# Patient Record
Sex: Male | Born: 1980 | Race: Asian | Hispanic: No | Marital: Married | Smoking: Former smoker
Health system: Southern US, Community
[De-identification: ages and names within clinical notes are randomized; demographics above are authoritative.]

---

## 2017-08-13 ENCOUNTER — Ambulatory Visit (INDEPENDENT_AMBULATORY_CARE_PROVIDER_SITE_OTHER): Payer: 59

## 2017-08-13 ENCOUNTER — Encounter: Payer: Self-pay | Admitting: Family Medicine

## 2017-08-13 ENCOUNTER — Ambulatory Visit: Payer: 59 | Admitting: Family Medicine

## 2017-08-13 VITALS — BP 100/80 | HR 91 | Ht 69.0 in | Wt 167.8 lb

## 2017-08-13 DIAGNOSIS — M79645 Pain in left finger(s): Secondary | ICD-10-CM | POA: Diagnosis not present

## 2017-08-13 DIAGNOSIS — M79642 Pain in left hand: Secondary | ICD-10-CM | POA: Insufficient documentation

## 2017-08-13 MED ORDER — DICLOFENAC SODIUM 1 % TD GEL: 4.0000 g | Freq: Four times a day (QID) | TRANSDERMAL | 1 refills | Status: DC

## 2017-08-13 NOTE — Progress Notes (Signed)
Subjective:  Leroy Johnson is a 36 y.o. male who presents today with a chief complaint of left index finger pain and to establish care.   HPI:  Left index finger pain, new problem Several year history.  He was previously seen by an orthopedist in [place].  Symptoms have significantly worsened over the past 10 days.  No clear precipitating event.  No history of prior trauma.  Has not tried any medications over the last 10 days.  Symptoms are worse with movement.  No other alleviating or aggravating factors noted.  Summary/review of notes from his orthopedist: Patient initially seen on 02/08/2017 for left index finger pain.  Physical exam noted tenderness at his metacarpal head.  He was given a prescription for Tylenol and diclofenac.  He had follow-up on 01/24/2017 with persistent pain.  At this point he had an injection performed.  Per orthopedics note consider MRI if symptoms not improving.  Lab work was also performed-see objective section for summary and review.   ROS: Per HPI, otherwise a 14 point review of systems was performed and was negative  PMH:  The following were reviewed and entered/updated in epic: History reviewed. No pertinent past medical history. Patient Active Problem List   Diagnosis Date Noted  . Pain of left hand 08/13/2017   History reviewed. No pertinent surgical history.  Family History  Problem Relation Age of Onset  . Hypertension Mother   . Diabetes Mother   . Diabetes Father    Medications- reviewed and updated No current outpatient medications on file.   No current facility-administered medications for this visit.     Allergies-reviewed and updated No Known Allergies  Social History   Socioeconomic History  . Marital status: Married    Spouse name: None  . Number of children: 1  . Years of education: None  . Highest education level: None  Social Needs  . Financial resource strain: None  . Food insecurity - worry: None  . Food  insecurity - inability: None  . Transportation needs - medical: None  . Transportation needs - non-medical: None  Occupational History  . Occupation: Sport and exercise psychologistoftware Engineer  Tobacco Use  . Smoking status: Former Games developermoker  . Smokeless tobacco: Never Used  Substance and Sexual Activity  . Alcohol use: Yes    Comment: Socially/Weekends  . Drug use: No  . Sexual activity: Yes    Partners: Female  Other Topics Concern  . None  Social History Narrative  . None   Objective:  Physical Exam: BP 100/80   Pulse 91   Ht 5\' 9"  (1.753 m)   Wt 167 lb 12.8 oz (76.1 kg)   SpO2 98%   BMI 24.78 kg/m   Gen: NAD, resting comfortably CV: RRR with no murmurs appreciated Pulm: NWOB, CTAB with no crackles, wheezes, or rhonchi GI: Normal bowel sounds present. Soft, Nontender, Nondistended. MSK:  -Left index finger: No deformities.  Full range of motion.  Strength intact.  Neurovascularly intact distally.  Nontender over MCP joint. Skin: Warm, dry Neuro: Grossly normal, moves all extremities Psych: Normal affect and thought content  Review of labs from previous provider: CBC 01/24/2017: WBC 7.7, hemoglobin 14.8, hematocrit 45.8, platelets 227  CRP 01/24/2017: 2.0 RF 01/24/2017: 7.4 Uric acid 01/24/2017: 3.0  Assessment/Plan:  Pain of left hand Unclear etiology.  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  He does have a couple of osteophytes on his plain film (moderate).  No obvious fracture or other abnormalities.  Labs performed earlier this year  in [place] do not reveal any obvious source for his pain.  His old records and labs will be scanned into the chart.  Will await radiology read of his plain film.  Start Voltaren gel.  If symptoms not improving in 1-2 weeks, would consider referral to sports medicine for intra-articular injection and further management.  May consider advanced imaging if symptoms continue to persist.   Kinslee Dalpe M. Jimmey RalphParker, MD 08/13/2017 11:00 AM

## 2017-08-13 NOTE — Patient Instructions (Signed)
Start the voltaren gel.  Let us know if not improving in 1-2 weeks.  Come back to see Dr Berline Choughigby if not.  Take care,  Dr Jimmey RalphParker

## 2017-08-13 NOTE — Assessment & Plan Note (Addendum)
Unclear etiology.  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  He does have a couple of osteophytes on his plain film (moderate).  No obvious fracture or other abnormalities.  Labs performed earlier this year in [place] do not reveal any obvious source for his pain.  His old records and labs will be scanned into the chart.  Will await radiology read of his plain film.  Start Voltaren gel.  If symptoms not improving in 1-2 weeks, would consider referral to sports medicine for intra-articular injection and further management.  May consider advanced imaging if symptoms continue to persist.

## 2017-08-14 ENCOUNTER — Encounter: Payer: Self-pay | Admitting: Family Medicine

## 2017-08-14 ENCOUNTER — Ambulatory Visit (INDEPENDENT_AMBULATORY_CARE_PROVIDER_SITE_OTHER): Payer: 59 | Admitting: Family Medicine

## 2017-08-14 VITALS — BP 100/80 | HR 85 | Ht 69.0 in | Wt 167.0 lb

## 2017-08-14 DIAGNOSIS — Z13220 Encounter for screening for lipoid disorders: Secondary | ICD-10-CM

## 2017-08-14 DIAGNOSIS — Z0000 Encounter for general adult medical examination without abnormal findings: Secondary | ICD-10-CM

## 2017-08-14 DIAGNOSIS — H547 Unspecified visual loss: Secondary | ICD-10-CM

## 2017-08-14 LAB — COMPREHENSIVE METABOLIC PANEL
ALBUMIN: 4.3 g/dL (ref 3.5–5.2)
ALK PHOS: 45 U/L (ref 39–117)
ALT: 33 U/L (ref 0–53)
AST: 22 U/L (ref 0–37)
BILIRUBIN TOTAL: 1.4 mg/dL — AB (ref 0.2–1.2)
BUN: 8 mg/dL (ref 6–23)
CO2: 31 mEq/L (ref 19–32)
Calcium: 9.5 mg/dL (ref 8.4–10.5)
Chloride: 102 mEq/L (ref 96–112)
Creatinine, Ser: 1.06 mg/dL (ref 0.40–1.50)
GFR: 82.91 mL/min (ref >60.00)
GLUCOSE: 100 mg/dL — AB (ref 70–99)
Potassium: 4.3 mEq/L (ref 3.5–5.1)
SODIUM: 137 meq/L (ref 135–145)
TOTAL PROTEIN: 7.2 g/dL (ref 6.0–8.3)

## 2017-08-14 LAB — CBC
HCT: 42.4 % (ref 39.0–52.0)
HEMOGLOBIN: 14.1 g/dL (ref 13.0–17.0)
MCHC: 33.3 g/dL (ref 30.0–36.0)
MCV: 86.4 fl (ref 78.0–100.0)
Platelets: 258 10*3/uL (ref 150.0–400.0)
RBC: 4.91 Mil/uL (ref 4.22–5.81)
RDW: 14.5 % (ref 11.5–15.5)
WBC: 5.4 10*3/uL (ref 4.0–10.5)

## 2017-08-14 LAB — LIPID PANEL
CHOLESTEROL: 186 mg/dL (ref 0–200)
HDL: 38.7 mg/dL — ABNORMAL LOW (ref >39.00)
LDL Cholesterol: 120 mg/dL — ABNORMAL HIGH (ref 0–99)
NONHDL: 147.46
Total CHOL/HDL Ratio: 5
Triglycerides: 138 mg/dL (ref 0.0–149.0)
VLDL: 27.6 mg/dL (ref 0.0–40.0)

## 2017-08-14 NOTE — Patient Instructions (Signed)
Preventive Care 18-39 Years, Male Preventive care refers to lifestyle choices and visits with your health care provider that can promote health and wellness. What does preventive care include?  A yearly physical exam. This is also called an annual well check.  Dental exams once or twice a year.  Routine eye exams. Ask your health care provider how often you should have your eyes checked.  Personal lifestyle choices, including: ? Daily care of your teeth and gums. ? Regular physical activity. ? Eating a healthy diet. ? Avoiding tobacco and drug use. ? Limiting alcohol use. ? Practicing safe sex. What happens during an annual well check? The services and screenings done by your health care provider during your annual well check will depend on your age, overall health, lifestyle risk factors, and family history of disease. Counseling Your health care provider may ask you questions about your:  Alcohol use.  Tobacco use.  Drug use.  Emotional well-being.  Home and relationship well-being.  Sexual activity.  Eating habits.  Work and work environment.  Screening You may have the following tests or measurements:  Height, weight, and BMI.  Blood pressure.  Lipid and cholesterol levels. These may be checked every 5 years starting at age 20.  Diabetes screening. This is done by checking your blood sugar (glucose) after you have not eaten for a while (fasting).  Skin check.  Hepatitis C blood test.  Hepatitis B blood test.  Sexually transmitted disease (STD) testing.  Discuss your test results, treatment options, and if necessary, the need for more tests with your health care provider. Vaccines Your health care provider may recommend certain vaccines, such as:  Influenza vaccine. This is recommended every year.  Tetanus, diphtheria, and acellular pertussis (Tdap, Td) vaccine. You may need a Td booster every 10 years.  Varicella vaccine. You may need this if you  have not been vaccinated.  HPV vaccine. If you are 26 or younger, you may need three doses over 6 months.  Measles, mumps, and rubella (MMR) vaccine. You may need at least one dose of MMR.You may also need a second dose.  Pneumococcal 13-valent conjugate (PCV13) vaccine. You may need this if you have certain conditions and have not been vaccinated.  Pneumococcal polysaccharide (PPSV23) vaccine. You may need one or two doses if you smoke cigarettes or if you have certain conditions.  Meningococcal vaccine. One dose is recommended if you are age 19-21 years and a first-year college student living in a residence hall, or if you have one of several medical conditions. You may also need additional booster doses.  Hepatitis A vaccine. You may need this if you have certain conditions or if you travel or work in places where you may be exposed to hepatitis A.  Hepatitis B vaccine. You may need this if you have certain conditions or if you travel or work in places where you may be exposed to hepatitis B.  Haemophilus influenzae type b (Hib) vaccine. You may need this if you have certain risk factors.  Talk to your health care provider about which screenings and vaccines you need and how often you need them. This information is not intended to replace advice given to you by your health care provider. Make sure you discuss any questions you have with your health care provider. Document Released: 11/07/2001 Document Revised: 05/31/2016 Document Reviewed: 07/13/2015 Elsevier Interactive Patient Education  2017 Elsevier Inc.  

## 2017-08-14 NOTE — Progress Notes (Signed)
Subjective:  Leroy ChessmanRenganathan Johnson is a 36 y.o. male who presents today for his annual comprehensive physical exam.    HPI:  Leroy GottronRenganathan Johnson BuryKannan has no acute complaints today.   Moved here a few months ago. Works as a Sport and exercise psychologistsoftware engineer. Likes to walk as stress relief.  Lifestyle Diet: Plenty of fruits and vegetables. Mostly vegetarian diet.  Exercise: Walk everyday.   Depression screen PHQ 2/9 08/13/2017  Decreased Interest 0  Down, Depressed, Hopeless 0  PHQ - 2 Score 0   Health Maintenance Due  Topic Date Due  . HIV Screening  03/06/1994    ROS: Per HPI, otherwise a 14 point review of systems was performed and was negative  PMH:  The following were reviewed and entered/updated in epic: History reviewed. No pertinent past medical history. Patient Active Problem List   Diagnosis Date Noted  . Pain of left hand 08/13/2017   History reviewed. No pertinent surgical history.  Family History  Problem Relation Age of Onset  . Hypertension Mother   . Diabetes Mother   . Diabetes Father    Medications- reviewed and updated Current Outpatient Medications  Medication Sig Dispense Refill  . diclofenac sodium (VOLTAREN) 1 % GEL Apply 4 g 4 (four) times daily topically. 100 g 1   No current facility-administered medications for this visit.     Allergies-reviewed and updated No Known Allergies  Social History   Socioeconomic History  . Marital status: Married    Spouse name: None  . Number of children: 1  . Years of education: None  . Highest education level: None  Social Needs  . Financial resource strain: None  . Food insecurity - worry: None  . Food insecurity - inability: None  . Transportation needs - medical: None  . Transportation needs - non-medical: None  Occupational History  . Occupation: Sport and exercise psychologistoftware Engineer  Tobacco Use  . Smoking status: Former Games developermoker  . Smokeless tobacco: Never Used  Substance and Sexual Activity  . Alcohol use: Yes    Comment:  Socially/Weekends  . Drug use: No  . Sexual activity: Yes    Partners: Female  Other Topics Concern  . None  Social History Narrative  . None    Objective:  Physical Exam: BP 100/80   Pulse 85   Ht 5\' 9"  (1.753 m)   Wt 167 lb (75.8 kg)   SpO2 98%   BMI 24.66 kg/m   Body mass index is 24.66 kg/m. Gen: NAD, resting comfortably HEENT: TMs normal bilaterally. OP clear. No thyromegaly noted.  CV: RRR with no murmurs appreciated Pulm: NWOB, CTAB with no crackles, wheezes, or rhonchi GI: Normal bowel sounds present. Soft, Nontender, Nondistended. MSK: no edema, cyanosis, or clubbing noted Skin: warm, dry Neuro: CN2-12 grossly intact. Strength 5/5 in upper and lower extremities. Reflexes symmetric and intact bilaterally.  Psych: Normal affect and thought content  Assessment/Plan:   Preventative Healthcare: Check lipid panel.  Check basic labs.  HIV antibody deferred.  Patient Counseling:  -Nutrition: Stressed importance of moderation in sodium/caffeine intake, saturated fat and cholesterol, caloric balance, sufficient intake of fresh fruits, vegetables, and fiber.  -Stressed the importance of regular exercise.   -Substance Abuse: Discussed cessation/primary prevention of tobacco, alcohol, or other drug use; driving or other dangerous activities under the influence; availability of treatment for abuse.   -Injury prevention: Discussed safety belts, safety helmets, smoke detector, smoking near bedding or upholstery.   -Dental health: Discussed importance of regular tooth brushing, flossing, and dental visits.  -  Health maintenance and immunizations reviewed. Please refer to Health maintenance section.  Return to care in 1 year for next preventative visit.   Katina Degreealeb M. Jimmey RalphParker, MD 08/14/2017 9:17 AM

## 2017-08-15 ENCOUNTER — Telehealth: Payer: Self-pay

## 2017-08-15 NOTE — Progress Notes (Signed)
"  Bad" cholesterol is a little low and "good" cholesterol is a little high. No need for medications at this time. Continue working on diet and exercise.   Other labs are normal.   We can recheck again next year.  Katina Degreealeb M. Jimmey RalphParker, MD 08/15/2017 8:39 AM

## 2017-08-15 NOTE — Telephone Encounter (Signed)
PA for Voltaren gel approved through 08/15/2018.  Patient's pharmacy notified.

## 2017-12-05 ENCOUNTER — Ambulatory Visit: Payer: 59 | Admitting: Physician Assistant

## 2017-12-05 ENCOUNTER — Encounter: Payer: Self-pay | Admitting: Physician Assistant

## 2017-12-05 VITALS — BP 116/82 | HR 115 | Temp 100.4°F | Ht 69.0 in | Wt 170.6 lb

## 2017-12-05 DIAGNOSIS — J029 Acute pharyngitis, unspecified: Secondary | ICD-10-CM | POA: Diagnosis not present

## 2017-12-05 DIAGNOSIS — R509 Fever, unspecified: Secondary | ICD-10-CM | POA: Diagnosis not present

## 2017-12-05 LAB — POC INFLUENZA A&B (BINAX/QUICKVUE)
Influenza A, POC: NEGATIVE
Influenza B, POC: NEGATIVE

## 2017-12-05 LAB — POCT RAPID STREP A (OFFICE): RAPID STREP A SCREEN: NEGATIVE

## 2017-12-05 NOTE — Patient Instructions (Addendum)
It was great to see you!  We have sent off your throat culture and will call you when we get the results.  You have a viral upper respiratory infection. Antibiotics are not needed for this.  Viral infections usually take 7-10 days to resolve.  The cough can last a few weeks to go away.  Push fluids and get plenty of rest. Please return if you are not improving as expected, or if you have high fevers (>101.5) or difficulty swallowing or worsening productive cough.  Call clinic with questions.  I hope you start feeling better soon!

## 2017-12-05 NOTE — Progress Notes (Signed)
Leroy Johnson is a 37 y.o. male here for a new problem.  I acted as a Neurosurgeon for [employer], PA-C Leroy Mull, LPN  History of Present Illness:   Chief Complaint  Patient presents with  . Fever    Started Sunday running more then 100.0  . Sore Throat    started Sunday     Sore Throat   This is a new problem. Episode onset: Started on Sunday. The problem has been gradually worsening. The maximum temperature recorded prior to his arrival was 100.4 - 100.9 F. The fever has been present for 1 to 2 days. Associated symptoms include coughing, headaches and trouble swallowing. Pertinent negatives include no abdominal pain, congestion, diarrhea, ear discharge, ear pain, neck pain, shortness of breath or vomiting. Associated symptoms comments: Non productive cough. He has tried nothing for the symptoms.   He reports that he continues to have these episodes every 2-3 months -- sore throat and fever and it will last 4-5 days. He has been dealing with this for "at least 20 years" and is frustrated because he doesn't know what's going on. He is unable to tell me if he has ever been tested for strep. He states that he usually just takes Tylenol and deals with it. Denies recent travel and SOB.  History reviewed. No pertinent past medical history.   Social History   Socioeconomic History  . Marital status: Married    Spouse name: Not on file  . Number of children: 1  . Years of education: Not on file  . Highest education level: Not on file  Social Needs  . Financial resource strain: Not on file  . Food insecurity - worry: Not on file  . Food insecurity - inability: Not on file  . Transportation needs - medical: Not on file  . Transportation needs - non-medical: Not on file  Occupational History  . Occupation: Sport and exercise psychologist  Tobacco Use  . Smoking status: Former Games developer  . Smokeless tobacco: Never Used  Substance and Sexual Activity  . Alcohol use: Yes    Comment:  Socially/Weekends  . Drug use: No  . Sexual activity: Yes    Partners: Female  Other Topics Concern  . Not on file  Social History Narrative  . Not on file    History reviewed. No pertinent surgical history.  Family History  Problem Relation Age of Onset  . Hypertension Mother   . Diabetes Mother   . Diabetes Father     No Known Allergies  Current Medications:  No current outpatient medications on file.   Review of Systems:   Review of Systems  HENT: Positive for trouble swallowing. Negative for congestion, ear discharge and ear pain.   Respiratory: Positive for cough. Negative for shortness of breath.   Gastrointestinal: Negative for abdominal pain, diarrhea and vomiting.  Musculoskeletal: Negative for neck pain.  Neurological: Positive for headaches.    Vitals:   Vitals:   12/05/17 1031  BP: 116/82  Pulse: (!) 115  Temp: (!) 100.4 F (38 C)  TempSrc: Oral  SpO2: 96%  Weight: 170 lb 9.6 oz (77.4 kg)  Height: 5\' 9"  (1.753 m)     Body mass index is 25.19 kg/m.  Physical Exam:   Physical Exam  Constitutional: He appears well-developed. He is cooperative.  Non-toxic appearance. He does not have a sickly appearance. He does not appear ill. No distress.  HENT:  Head: Normocephalic and atraumatic.  Right Ear: Tympanic membrane, external ear and  ear canal normal. Tympanic membrane is not erythematous, not retracted and not bulging.  Left Ear: Tympanic membrane, external ear and ear canal normal. Tympanic membrane is not erythematous, not retracted and not bulging.  Nose: Mucosal edema and rhinorrhea present. Right sinus exhibits no maxillary sinus tenderness and no frontal sinus tenderness. Left sinus exhibits no maxillary sinus tenderness and no frontal sinus tenderness.  Mouth/Throat: Uvula is midline and mucous membranes are normal. Posterior oropharyngeal erythema present. No posterior oropharyngeal edema. Tonsils are 1+ on the right. Tonsils are 1+ on the  left. No tonsillar exudate.  Post nasal drip present  Eyes: Conjunctivae and lids are normal.  Neck: Trachea normal.  Cardiovascular: Normal rate, regular rhythm, S1 normal, S2 normal and normal heart sounds.  Pulmonary/Chest: Effort normal and breath sounds normal. He has no decreased breath sounds. He has no wheezes. He has no rhonchi. He has no rales.  Lymphadenopathy:    He has no cervical adenopathy.  Neurological: He is alert.  Skin: Skin is warm, dry and intact.  Psychiatric: He has a normal mood and affect. His speech is normal and behavior is normal.  Nursing note and vitals reviewed.  Results for orders placed or performed in visit on 12/05/17  POCT rapid strep A  Result Value Ref Range   Rapid Strep A Screen Negative Negative  POC Influenza A&B(BINAX/QUICKVUE)  Result Value Ref Range   Influenza A, POC Negative Negative   Influenza B, POC Negative Negative    Assessment and Plan:    Leroy Johnson was seen today for fever and sore throat.  Diagnoses and all orders for this visit:  Acute pharyngitis, unspecified etiology and Fever, unspecified fever cause Flu and strep negative. No red flags on exam.  Continue supportive care. Discussed alternating tylenol and ibuprofen to help with symptoms. No indications for antibiotics at this time. Reviewed return precautions including worsening fever, SOB, worsening cough or other concerns. Push fluids and rest. I recommend that patient follow-up if symptoms worsen or persist despite treatment x 7-10 days, sooner if needed. I did get a throat culture, will contact patient when that results. He also requested a ENT consult today as he continues to deal with these infections on a regular basis and he wants a more thorough evaluation of his throat. -     POCT rapid strep A -     POC Influenza A&B(BINAX/QUICKVUE) -     Ambulatory referral to ENT -     Culture, Group A Strep   . Reviewed expectations re: course of current medical  issues. . Discussed self-management of symptoms. . Outlined signs and symptoms indicating need for more acute intervention. . Patient verbalized understanding and all questions were answered. . See orders for this visit as documented in the electronic medical record. . Patient received an After-Visit Summary.  CMA or LPN served as scribe during this visit. History, Physical, and Plan performed by medical provider. Documentation and orders reviewed and attested to.  Jarold MottoSamantha Tytionna Cloyd, PA-C

## 2017-12-06 ENCOUNTER — Encounter: Payer: Self-pay | Admitting: Physician Assistant

## 2017-12-07 LAB — CULTURE, GROUP A STREP
MICRO NUMBER: 90320159
SPECIMEN QUALITY: ADEQUATE

## 2017-12-11 ENCOUNTER — Encounter: Payer: Self-pay | Admitting: *Deleted

## 2017-12-11 DIAGNOSIS — J029 Acute pharyngitis, unspecified: Secondary | ICD-10-CM | POA: Diagnosis not present

## 2018-03-26 ENCOUNTER — Encounter: Payer: Self-pay | Admitting: Family Medicine

## 2018-03-26 ENCOUNTER — Ambulatory Visit: Payer: 59 | Admitting: Family Medicine

## 2018-03-26 VITALS — BP 118/64 | HR 97 | Temp 98.9°F | Ht 69.0 in | Wt 167.0 lb

## 2018-03-26 DIAGNOSIS — J329 Chronic sinusitis, unspecified: Secondary | ICD-10-CM

## 2018-03-26 DIAGNOSIS — J029 Acute pharyngitis, unspecified: Secondary | ICD-10-CM

## 2018-03-26 LAB — POCT RAPID STREP A (OFFICE): RAPID STREP A SCREEN: NEGATIVE

## 2018-03-26 MED ORDER — IPRATROPIUM BROMIDE 0.06 % NA SOLN: 2.0000 | Freq: Four times a day (QID) | NASAL | 0 refills | Status: DC

## 2018-03-26 NOTE — Patient Instructions (Signed)
It was very nice to see you today!  Please start the atrovent.   Your strep test today is negative.  We will schedule a head CT to make sure there is nothing else going on.  Please stay well-hydrated.  You can also use Tylenol and/or Motrin as needed.  Please let me know if your symptoms worsen or do not improve over the next few days.  Take care, Dr Jimmey RalphParker

## 2018-03-26 NOTE — Progress Notes (Signed)
   Subjective:  Leroy Johnson is a 37 y.o. male who presents today for same-day appointment with a chief complaint of sore throat.   HPI:  Sore Throat, acute problem Symptoms started about 5 days ago with a headache. This progressed to fever and sore throat over the past few days. Patient had a similar episode about 3 months ago and was referred to ENT.  Patient reports he was seen by ENT who told him there were no abnormalities and nothing else need to be done.  He is tried taking Tylenol which is helped some with his fever.  No cough.  No other sick contacts.  No reflux type symptoms.  No obvious alleviating or aggravating factors.  ROS: Per HPI  PMH: He reports that he has quit smoking. He has never used smokeless tobacco. He reports that he drinks alcohol. He reports that he does not use drugs.  Objective:  Physical Exam: BP 118/64 (BP Location: Left Arm, Patient Position: Sitting, Cuff Size: Normal)   Pulse 97   Temp 98.9 F (37.2 C) (Oral)   Ht 5\' 9"  (1.753 m)   Wt 167 lb (75.8 kg)   SpO2 97%   BMI 24.66 kg/m   Gen: NAD, resting comfortably HEENT: Bilateral EAC obstructed by cerumen.  Oropharynx erythematous without exudate.  Nasal mucosa erythematous with clear discharge bilaterally.  Maxillary sinuses with decreased transillumination bilaterally CV: RRR with no murmurs appreciated Pulm: NWOB, CTAB with no crackles, wheezes, or rhonchi  Rapid Strep negative  Assessment/Plan:  Sore throat/sinusitis Rapid strep negative.  Symptoms likely secondary to viral infections.  Unclear why he has recurrent symptoms every few months, though likely represents recurrent viral infections.  Will start Atrovent nasal spray to help him with his sinus congestion and rhinorrhea today.  Patient is concerned there may be something wrong with his sinuses.  We will check a facial CT to further evaluate anatomy of sinuses.  Also encouraged good oral hydration.  Recommended Tylenol and/or Motrin  as needed.  Discussed reasons to return to care.  Katina Degreealeb M. Jimmey RalphParker, MD 03/26/2018 4:10 PM

## 2018-04-25 ENCOUNTER — Encounter: Payer: Self-pay | Admitting: Family Medicine

## 2018-04-25 NOTE — Telephone Encounter (Signed)
[facility]- I will also reply to the patient and let him know he can contact them directly and schedule.

## 2018-05-06 ENCOUNTER — Ambulatory Visit
Admission: RE | Admit: 2018-05-06 | Discharge: 2018-05-06 | Disposition: A | Discharge status: Home / Self Care | Payer: 59 | Source: Ambulatory Visit | Attending: Family Medicine | Admitting: Family Medicine

## 2018-05-06 DIAGNOSIS — J323 Chronic sphenoidal sinusitis: Secondary | ICD-10-CM | POA: Diagnosis not present

## 2018-05-06 DIAGNOSIS — J329 Chronic sinusitis, unspecified: Secondary | ICD-10-CM

## 2018-05-06 DIAGNOSIS — J029 Acute pharyngitis, unspecified: Secondary | ICD-10-CM

## 2018-05-07 NOTE — Progress Notes (Signed)
Please inform patient of the following:  He has very mild inflammation in one of his left sinuses. Would like for him to see ENT if he does not have any improvement with the meds we discussed at his last appointment or if he continues to have recurrent symptoms.  Katina Degreealeb M. Jimmey RalphParker, MD 05/07/2018 8:33 AM

## 2018-07-15 ENCOUNTER — Encounter: Payer: Self-pay | Admitting: Family Medicine

## 2018-07-15 ENCOUNTER — Ambulatory Visit: Payer: 59 | Admitting: Family Medicine

## 2018-07-15 VITALS — BP 112/64 | HR 100 | Temp 98.9°F | Wt 171.1 lb

## 2018-07-15 DIAGNOSIS — J029 Acute pharyngitis, unspecified: Secondary | ICD-10-CM

## 2018-07-15 DIAGNOSIS — J329 Chronic sinusitis, unspecified: Secondary | ICD-10-CM

## 2018-07-15 DIAGNOSIS — M25562 Pain in left knee: Secondary | ICD-10-CM

## 2018-07-15 DIAGNOSIS — G8929 Other chronic pain: Secondary | ICD-10-CM

## 2018-07-15 MED ORDER — DICLOFENAC SODIUM 75 MG PO TBEC: 75.0000 mg | DELAYED_RELEASE_TABLET | Freq: Two times a day (BID) | ORAL | 0 refills | Status: DC

## 2018-07-15 NOTE — Patient Instructions (Signed)
It was very nice to see you today!  I think you have some damage done to your cartilage in your knee. Please take the anti-inflammatories and work on the exercises.  Let me know if not improving.  I will also place a referral to the ENT for you as well.   Take care, Dr Jimmey Ralph

## 2018-07-15 NOTE — Progress Notes (Signed)
   Subjective:  Leroy Johnson is a 37 y.o. male who presents today for same-day appointment with a chief complaint of knee pain.   HPI:  Knee Pain, Acute problem Started 2-3 weeks ago.Stable over that time. Located in left knee.  Worse with going from sitting to standing.  Occasionally pops.  No catching or locking.  No swelling.  No specific treatments tried.  He plays tennis regularly.  No obvious injuries or other precipitating events.  No obvious alleviating or aggravating factors.  Sore throat, chronic problem, stable Seen about 3 months ago for this.  Underwent CT scan which showed mild sinusitis.  Symptoms have persisted  ROS: Per HPI  PMH: He reports that he has quit smoking. He has never used smokeless tobacco. He reports that he drinks alcohol. He reports that he does not use drugs.  Objective:  Physical Exam: BP 112/64   Pulse 100   Temp 98.9 F (37.2 C) (Oral)   Wt 171 lb 2 oz (77.6 kg)   SpO2 98%   BMI 25.27 kg/m   Gen: NAD, resting comfortably CV: RRR with no murmurs appreciated Pulm: NWOB, CTAB with no crackles, wheezes, or rhonchi GI: Normal bowel sounds present. Soft, Nontender, Nondistended. MSK:  -Left knee: No deformities.  Palpable click with active range of motion.  Stable to varus and valgus stress.  Anterior and  posterior drawer signs negative.  McMurray test positive. -Right knee: No deformities.  Full range of motion without abnormality.  Stable to varus and valgus stress.  Anterior and posterior drawer signs negative.  Assessment/Plan:  Knee pain Exam consistent with meniscal tear.  Discussed obtaining MRI for definitive diagnosis, however patient declined.  We will proceed with conservative management including home exercise program and a course of diclofenac.  Recommended compression to the area as well.  Discussed reasons to return to care.  If symptoms worsen or do not improve with above, will need MRI and/or referral to sports medicine or  orthopedics.  Chronic sinusitis/sore throat Referral to ENT placed today.  Katina Degree. Jimmey Ralph, MD 07/15/2018 11:53 AM

## 2018-07-19 DIAGNOSIS — J342 Deviated nasal septum: Secondary | ICD-10-CM | POA: Diagnosis not present

## 2018-07-19 DIAGNOSIS — J320 Chronic maxillary sinusitis: Secondary | ICD-10-CM | POA: Diagnosis not present

## 2018-07-19 DIAGNOSIS — H6122 Impacted cerumen, left ear: Secondary | ICD-10-CM | POA: Diagnosis not present

## 2018-07-26 DIAGNOSIS — J320 Chronic maxillary sinusitis: Secondary | ICD-10-CM | POA: Diagnosis not present

## 2018-07-26 DIAGNOSIS — J305 Allergic rhinitis due to food: Secondary | ICD-10-CM | POA: Diagnosis not present

## 2018-07-26 DIAGNOSIS — J322 Chronic ethmoidal sinusitis: Secondary | ICD-10-CM | POA: Diagnosis not present

## 2018-08-12 ENCOUNTER — Encounter: Payer: Self-pay | Admitting: Family Medicine

## 2018-08-28 ENCOUNTER — Encounter: Payer: Self-pay | Admitting: Family Medicine

## 2018-08-28 ENCOUNTER — Other Ambulatory Visit: Payer: Self-pay

## 2018-08-28 DIAGNOSIS — M25562 Pain in left knee: Secondary | ICD-10-CM

## 2018-09-13 ENCOUNTER — Other Ambulatory Visit: Payer: 59

## 2019-01-08 ENCOUNTER — Telehealth: Payer: Self-pay | Admitting: Family Medicine

## 2019-01-08 NOTE — Telephone Encounter (Signed)
Pt has not been seen in over a year. Unable to leave vm  °

## 2019-06-05 IMAGING — DX DG FINGER INDEX 2+V*L*
3 series · 3 of 3 positions shown · non-contrast
Comparison: None in PACs

CLINICAL DATA: One year of pain centered over the MCP joint of the
index finger. No known injury or other trauma.

EXAM:
LEFT INDEX FINGER 2+V

[finger pa]
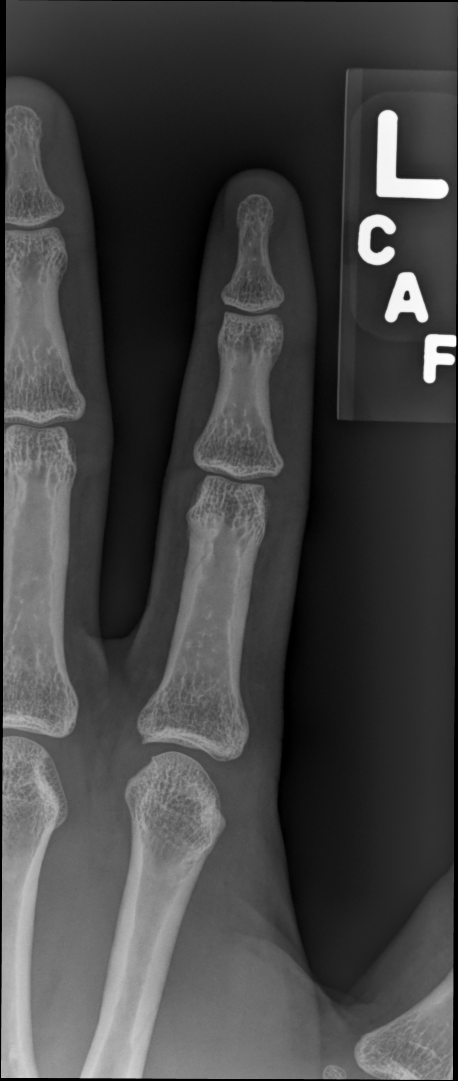

[finger oblique]
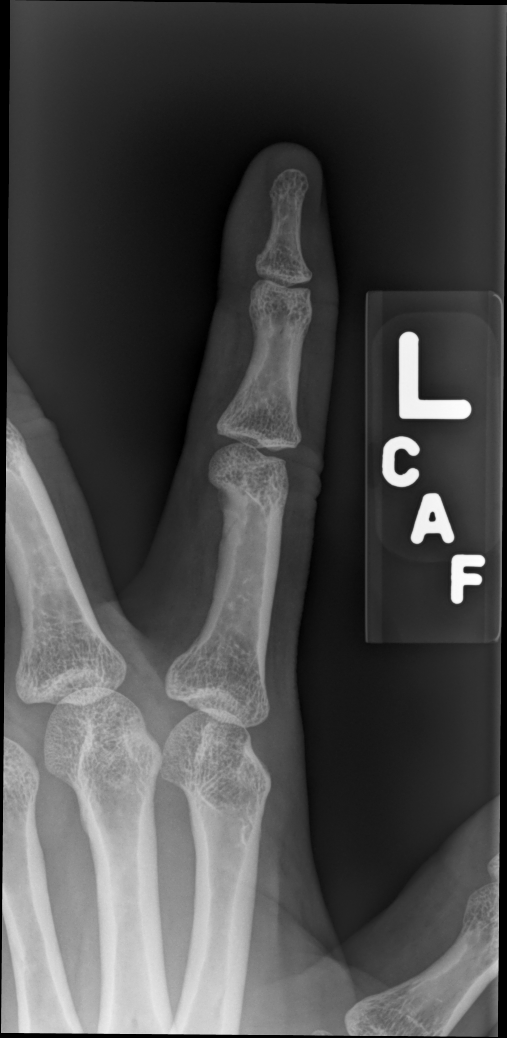

[finger lat]
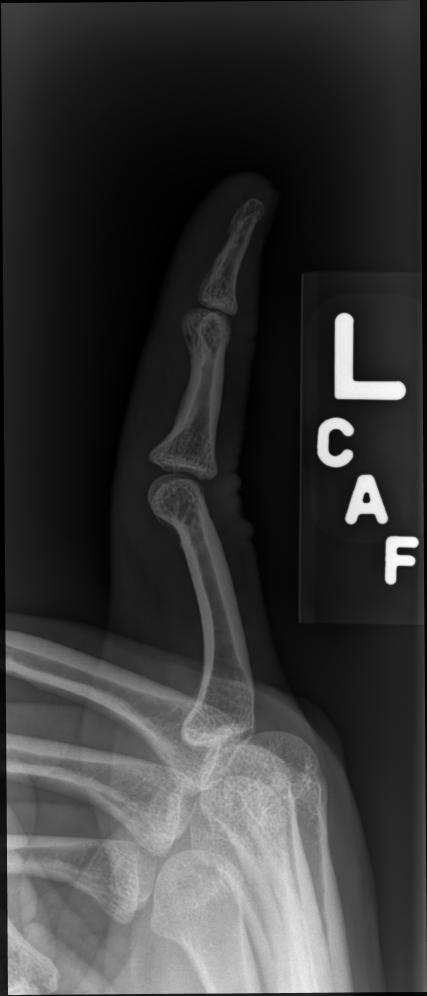

[3 of 3 positions shown; findings below may reference images not displayed]

FINDINGS: The bones of the index finger are subjectively adequately
mineralized. The IP joint spaces are well maintained. The MCP joint
space is normal. Minimal marginal spurring is noted from the lateral
aspect of the base of the proximal phalanx of the index finger. The
soft tissues are unremarkable.
IMPRESSION: There is no acute bony abnormality of the index finger. Minimal
spurring of the base of the proximal phalanx is observed.

## 2020-01-03 ENCOUNTER — Ambulatory Visit: Payer: Self-pay | Attending: Internal Medicine

## 2020-01-03 DIAGNOSIS — Z23 Encounter for immunization: Secondary | ICD-10-CM

## 2020-01-03 NOTE — Progress Notes (Signed)
   Covid-19 Vaccination Clinic  Name:  Keino Placencia    MRN: [id number] DOB: [birth year]  01/03/2020  Mr. Fayette was observed post Covid-19 immunization for 15 minutes without incident. He was provided with Vaccine Information Sheet and instruction to access the V-Safe system.   Mr. Rudin was instructed to call 911 with any severe reactions post vaccine: Marland Kitchen Difficulty breathing  . Swelling of face and throat  . A fast heartbeat  . A bad rash all over body  . Dizziness and weakness   Immunizations Administered    Name Date Dose VIS Date Route   Pfizer COVID-19 Vaccine 01/03/2020  3:10 PM 0.3 mL 09/05/2019 Intramuscular   Manufacturer: [employer], [employer]   Lot: [id number]   NDC: [address]-2

## 2020-01-23 ENCOUNTER — Encounter: Payer: 59 | Admitting: Family Medicine

## 2020-01-26 ENCOUNTER — Ambulatory Visit: Payer: Self-pay | Attending: Internal Medicine

## 2020-01-26 DIAGNOSIS — Z23 Encounter for immunization: Secondary | ICD-10-CM

## 2020-01-26 NOTE — Progress Notes (Signed)
   Covid-19 Vaccination Clinic  Name:  Leroy Johnson    MRN: [id number] DOB: [birth year]  01/26/2020  Mr. [name] was observed post Covid-19 immunization for 15 minutes without incident. He was provided with Vaccine Information Sheet and instruction to access the V-Safe system.   Mr. [name] was instructed to call 911 with any severe reactions post vaccine: Marland Kitchen Difficulty breathing  . Swelling of face and throat  . A fast heartbeat  . A bad rash all over body  . Dizziness and weakness   Immunizations Administered    Name Date Dose VIS Date Route   Pfizer COVID-19 Vaccine 01/26/2020  1:25 PM 0.3 mL 11/19/2018 Intramuscular   Manufacturer: [employer], [employer]   Lot: [id number]   NDC: [address]-4

## 2020-01-28 ENCOUNTER — Encounter: Payer: Self-pay | Admitting: Family Medicine

## 2020-02-06 ENCOUNTER — Encounter: Payer: Self-pay | Admitting: Family Medicine

## 2020-02-06 ENCOUNTER — Ambulatory Visit (INDEPENDENT_AMBULATORY_CARE_PROVIDER_SITE_OTHER): Payer: Managed Care, Other (non HMO) | Admitting: Family Medicine

## 2020-02-06 ENCOUNTER — Other Ambulatory Visit: Payer: Self-pay

## 2020-02-06 VITALS — BP 102/70 | HR 83 | Temp 97.9°F | Ht 69.0 in | Wt 169.0 lb

## 2020-02-06 DIAGNOSIS — Z13220 Encounter for screening for lipoid disorders: Secondary | ICD-10-CM | POA: Diagnosis not present

## 2020-02-06 DIAGNOSIS — R739 Hyperglycemia, unspecified: Secondary | ICD-10-CM

## 2020-02-06 DIAGNOSIS — Z0001 Encounter for general adult medical examination with abnormal findings: Secondary | ICD-10-CM | POA: Diagnosis not present

## 2020-02-06 LAB — COMPREHENSIVE METABOLIC PANEL
ALT: 21 U/L (ref 0–53)
AST: 18 U/L (ref 0–37)
Albumin: 4.6 g/dL (ref 3.5–5.2)
Alkaline Phosphatase: 60 U/L (ref 39–117)
BUN: 15 mg/dL (ref 6–23)
CO2: 29 mEq/L (ref 19–32)
Calcium: 9.1 mg/dL (ref 8.4–10.5)
Chloride: 104 mEq/L (ref 96–112)
Creatinine, Ser: 1.2 mg/dL (ref 0.40–1.50)
GFR: 66.75 mL/min (ref >60.00)
Glucose, Bld: 94 mg/dL (ref 70–99)
Potassium: 4 mEq/L (ref 3.5–5.1)
Sodium: 140 mEq/L (ref 135–145)
Total Bilirubin: 1.6 mg/dL — ABNORMAL HIGH (ref 0.2–1.2)
Total Protein: 7.1 g/dL (ref 6.0–8.3)

## 2020-02-06 LAB — TSH: TSH: 1.53 u[IU]/mL (ref 0.35–4.50)

## 2020-02-06 LAB — CBC
HCT: 42.8 % (ref 39.0–52.0)
Hemoglobin: 14.5 g/dL (ref 13.0–17.0)
MCHC: 33.9 g/dL (ref 30.0–36.0)
MCV: 85.2 fl (ref 78.0–100.0)
Platelets: 246 10*3/uL (ref 150.0–400.0)
RBC: 5.02 Mil/uL (ref 4.22–5.81)
RDW: 14.3 % (ref 11.5–15.5)
WBC: 6.9 10*3/uL (ref 4.0–10.5)

## 2020-02-06 LAB — LIPID PANEL
Cholesterol: 193 mg/dL (ref 0–200)
HDL: 32.5 mg/dL — ABNORMAL LOW (ref >39.00)
LDL Cholesterol: 128 mg/dL — ABNORMAL HIGH (ref 0–99)
NonHDL: 160.36
Total CHOL/HDL Ratio: 6
Triglycerides: 163 mg/dL — ABNORMAL HIGH (ref 0.0–149.0)
VLDL: 32.6 mg/dL (ref 0.0–40.0)

## 2020-02-06 LAB — HEMOGLOBIN A1C: Hgb A1c MFr Bld: 5.6 % (ref 4.6–6.5)

## 2020-02-06 NOTE — Patient Instructions (Signed)
It was very nice to see you today!  We will check blood work today.  Keep up the good work!  We will see back in 1 year for your next physical, or sooner if needed.  Take care, Dr Jerline Pain  Please try these tips to maintain a healthy lifestyle:   Eat at least 3 REAL meals and 1-2 snacks per day.  Aim for no more than 5 hours between eating.  If you eat breakfast, please do so within one hour of getting up.    Each meal should contain half fruits/vegetables, one quarter protein, and one quarter carbs (no bigger than a computer mouse)   Cut down on sweet beverages. This includes juice, soda, and sweet tea.     Drink at least 1 glass of water with each meal and aim for at least 8 glasses per day   Exercise at least 150 minutes every week.    Preventive Care 36-41 Years Old, Male Preventive care refers to lifestyle choices and visits with your health care provider that can promote health and wellness. This includes:  A yearly physical exam. This is also called an annual well check.  Regular dental and eye exams.  Immunizations.  Screening for certain conditions.  Healthy lifestyle choices, such as eating a healthy diet, getting regular exercise, not using drugs or products that contain nicotine and tobacco, and limiting alcohol use. What can I expect for my preventive care visit? Physical exam Your health care provider will check:  Height and weight. These may be used to calculate body mass index (BMI), which is a measurement that tells if you are at a healthy weight.  Heart rate and blood pressure.  Your skin for abnormal spots. Counseling Your health care provider may ask you questions about:  Alcohol, tobacco, and drug use.  Emotional well-being.  Home and relationship well-being.  Sexual activity.  Eating habits.  Work and work Statistician. What immunizations do I need?  Influenza (flu) vaccine  This is recommended every year. Tetanus, diphtheria,  and pertussis (Tdap) vaccine  You may need a Td booster every 10 years. Varicella (chickenpox) vaccine  You may need this vaccine if you have not already been vaccinated. Zoster (shingles) vaccine  You may need this after age 62. Measles, mumps, and rubella (MMR) vaccine  You may need at least one dose of MMR if you were born in 1957 or later. You may also need a second dose. Pneumococcal conjugate (PCV13) vaccine  You may need this if you have certain conditions and were not previously vaccinated. Pneumococcal polysaccharide (PPSV23) vaccine  You may need one or two doses if you smoke cigarettes or if you have certain conditions. Meningococcal conjugate (MenACWY) vaccine  You may need this if you have certain conditions. Hepatitis A vaccine  You may need this if you have certain conditions or if you travel or work in places where you may be exposed to hepatitis A. Hepatitis B vaccine  You may need this if you have certain conditions or if you travel or work in places where you may be exposed to hepatitis B. Haemophilus influenzae type b (Hib) vaccine  You may need this if you have certain risk factors. Human papillomavirus (HPV) vaccine  If recommended by your health care provider, you may need three doses over 6 months. You may receive vaccines as individual doses or as more than one vaccine together in one shot (combination vaccines). Talk with your health care provider about the risks and  benefits of combination vaccines. What tests do I need? Blood tests  Lipid and cholesterol levels. These may be checked every 5 years, or more frequently if you are over 67 years old.  Hepatitis C test.  Hepatitis B test. Screening  Lung cancer screening. You may have this screening every year starting at age 67 if you have a 30-pack-year history of smoking and currently smoke or have quit within the past 15 years.  Prostate cancer screening. Recommendations will vary depending on  your family history and other risks.  Colorectal cancer screening. All adults should have this screening starting at age 2 and continuing until age 71. Your health care provider may recommend screening at age 32 if you are at increased risk. You will have tests every 1-10 years, depending on your results and the type of screening test.  Diabetes screening. This is done by checking your blood sugar (glucose) after you have not eaten for a while (fasting). You may have this done every 1-3 years.  Sexually transmitted disease (STD) testing. Follow these instructions at home: Eating and drinking  Eat a diet that includes fresh fruits and vegetables, whole grains, lean protein, and low-fat dairy products.  Take vitamin and mineral supplements as recommended by your health care provider.  Do not drink alcohol if your health care provider tells you not to drink.  If you drink alcohol: ? Limit how much you have to 0-2 drinks a day. ? Be aware of how much alcohol is in your drink. In the [place] one drink equals one 12 oz bottle of beer (355 mL), one 5 oz glass of wine (148 mL), or one 1 oz glass of hard liquor (44 mL). Lifestyle  Take daily care of your teeth and gums.  Stay active. Exercise for at least 30 minutes on 5 or more days each week.  Do not use any products that contain nicotine or tobacco, such as cigarettes, e-cigarettes, and chewing tobacco. If you need help quitting, ask your health care provider.  If you are sexually active, practice safe sex. Use a condom or other form of protection to prevent STIs (sexually transmitted infections).  Talk with your health care provider about taking a low-dose aspirin every day starting at age 75. What's next?  Go to your health care provider once a year for a well check visit.  Ask your health care provider how often you should have your eyes and teeth checked.  Stay up to date on all vaccines. This information is not intended to replace  advice given to you by your health care provider. Make sure you discuss any questions you have with your health care provider. Document Revised: 09/05/2018 Document Reviewed: 09/05/2018 Elsevier Patient Education  2020 [employer].

## 2020-02-06 NOTE — Progress Notes (Signed)
Chief Complaint:  Leroy Johnson is a 39 y.o. male who presents today for his annual comprehensive physical exam.    Assessment/Plan:  Chronic Problems Addressed Today: Hyperglycemia Check A1c, CBC, C met, TSH.  Preventative Healthcare: Check CBC, C met, TSH, lipid panel.  Has already received both doses of Covid vaccine.  Patient Counseling(The following topics were reviewed and/or handout was given):  -Nutrition: Stressed importance of moderation in sodium/caffeine intake, saturated fat and cholesterol, caloric balance, sufficient intake of fresh fruits, vegetables, and fiber.  -Stressed the importance of regular exercise.   -Substance Abuse: Discussed cessation/primary prevention of tobacco, alcohol, or other drug use; driving or other dangerous activities under the influence; availability of treatment for abuse.   -Injury prevention: Discussed safety belts, safety helmets, smoke detector, smoking near bedding or upholstery.   -Sexuality: Discussed sexually transmitted diseases, partner selection, use of condoms, avoidance of unintended pregnancy and contraceptive alternatives.   -Dental health: Discussed importance of regular tooth brushing, flossing, and dental visits.  -Health maintenance and immunizations reviewed. Please refer to Health maintenance section.  Return to care in 1 year for next preventative visit.     Subjective:  HPI:  He has no acute complaints today.   Lifestyle Diet: Balanced. Plenty of fruits and vegetables.  Exercise: Going on walks daily.   Depression screen PHQ 2/9 08/13/2017  Decreased Interest 0  Down, Depressed, Hopeless 0  PHQ - 2 Score 0    Health Maintenance Due  Topic Date Due  . HIV Screening  Never done     ROS: Per HPI, otherwise a complete review of systems was negative.   PMH:  The following were reviewed and entered/updated in epic: History reviewed. No pertinent past medical history. Patient Active Problem List   Diagnosis Date Noted  . Pain of left hand 08/13/2017   History reviewed. No pertinent surgical history.  Family History  Problem Relation Age of Onset  . Hypertension Mother   . Diabetes Mother   . Diabetes Father     Medications- reviewed and updated No current outpatient medications on file.   No current facility-administered medications for this visit.    Allergies-reviewed and updated No Known Allergies  Social History   Socioeconomic History  . Marital status: Married    Spouse name: Not on file  . Number of children: 1  . Years of education: Not on file  . Highest education level: Not on file  Occupational History  . Occupation: Financial planner  Tobacco Use  . Smoking status: Former Research scientist (life sciences)  . Smokeless tobacco: Never Used  Substance and Sexual Activity  . Alcohol use: Yes    Comment: Socially/Weekends  . Drug use: No  . Sexual activity: Yes    Partners: Female  Other Topics Concern  . Not on file  Social History Narrative  . Not on file   Social Determinants of Health   Financial Resource Strain:   . Difficulty of Paying Living Expenses:   Food Insecurity:   . Worried About Charity fundraiser in the Last Year:   . Arboriculturist in the Last Year:   Transportation Needs:   . Film/video editor (Medical):   Marland Kitchen Lack of Transportation (Non-Medical):   Physical Activity:   . Days of Exercise per Week:   . Minutes of Exercise per Session:   Stress:   . Feeling of Stress :   Social Connections:   . Frequency of Communication with Friends and Family:   .  Frequency of Social Gatherings with Friends and Family:   . Attends Religious Services:   . Active Member of Clubs or Organizations:   . Attends Archivist Meetings:   Marland Kitchen Marital Status:         Objective:  Physical Exam: BP 102/70 (BP Location: Left Arm, Patient Position: Sitting, Cuff Size: Normal)   Pulse 83   Temp 97.9 F (36.6 C) (Temporal)   Ht '5\' 9"'$  (1.753 m)   Wt 169  lb (76.7 kg)   SpO2 98%   BMI 24.96 kg/m   Body mass index is 24.96 kg/m. Wt Readings from Last 3 Encounters:  02/06/20 169 lb (76.7 kg)  07/15/18 171 lb 2 oz (77.6 kg)  03/26/18 167 lb (75.8 kg)   Gen: NAD, resting comfortably HEENT: TMs normal bilaterally. OP clear. No thyromegaly noted.  CV: RRR with no murmurs appreciated Pulm: NWOB, CTAB with no crackles, wheezes, or rhonchi GI: Normal bowel sounds present. Soft, Nontender, Nondistended. MSK: no edema, cyanosis, or clubbing noted Skin: warm, dry Neuro: CN2-12 grossly intact. Strength 5/5 in upper and lower extremities. Reflexes symmetric and intact bilaterally.  Psych: Normal affect and thought content     Leroy Johnson M. Jerline Pain, MD 02/06/2020 8:47 AM

## 2020-02-06 NOTE — Progress Notes (Signed)
Please inform patient of the following:  Cholesterol is borderline but slightly up since last time. Everything else is within acceptable ranges. He should keep working on diet and exercise and we can recheck in a year or so.  Katina Degree. Jimmey Ralph, MD 02/06/2020 1:16 PM

## 2020-11-30 ENCOUNTER — Telehealth (INDEPENDENT_AMBULATORY_CARE_PROVIDER_SITE_OTHER): Payer: 59 | Admitting: Family Medicine

## 2020-11-30 DIAGNOSIS — R0981 Nasal congestion: Secondary | ICD-10-CM

## 2020-11-30 DIAGNOSIS — R059 Cough, unspecified: Secondary | ICD-10-CM

## 2020-11-30 DIAGNOSIS — R0982 Postnasal drip: Secondary | ICD-10-CM | POA: Diagnosis not present

## 2020-11-30 MED ORDER — BENZONATATE 100 MG PO CAPS
100.0000 mg | ORAL_CAPSULE | Freq: Three times a day (TID) | ORAL | 0 refills | Status: DC | PRN
Start: 2020-11-30 — End: 2021-03-01

## 2020-11-30 MED ORDER — FLUTICASONE PROPIONATE 50 MCG/ACT NA SUSP
2.0000 | Freq: Every day | NASAL | 0 refills | Status: DC
Start: 2020-11-30 — End: 2021-03-01

## 2020-11-30 NOTE — Progress Notes (Signed)
Virtual Visit via Video Note  I connected with Cruise  on 11/30/20 at 10:40 AM EST by a video enabled telemedicine application and verified that I am speaking with the correct person using two identifiers.  Location patient: home, [place] Location provider:work or home office Persons participating in the virtual visit: patient, provider  I discussed the limitations of evaluation and management by telemedicine and the availability of in person appointments. The patient expressed understanding and agreed to proceed.   HPI:  Acute telemedicine visit for cough: -Onset: 2 weeks ago -Symptoms include: started with a cold a few weeks ago, lingering nasal congestion, PND and cough - mucus is clear at this point -Denies: fever, SOB, CP, facial pain, NVD, inability to get out of bed/eat/drink, fatigue or malaise -covid test was negative -energy is good -Has tried:cough syrup -Pertinent past medical history: some seasonal allergies in the past -Pertinent medication allergies:nkda -COVID-19 vaccine status: vaccinated x 3  ROS: See pertinent positives negatives per HPI. and  No past medical history on file.  No past surgical history on file.   Current Outpatient Medications:  .  benzonatate (TESSALON PERLES) 100 MG capsule, Take 1 capsule (100 mg total) by mouth 3 (three) times daily as needed., Disp: 20 capsule, Rfl: 0 .  fluticasone (FLONASE) 50 MCG/ACT nasal spray, Place 2 sprays into both nostrils daily., Disp: 16 g, Rfl: 0  EXAM:  VITALS per patient if applicable:  GENERAL: alert, oriented, appears well and in no acute distress  HEENT: atraumatic, conjunttiva clear, no obvious abnormalities on inspection of external nose and ears  NECK: normal movements of the head and neck  LUNGS: on inspection no signs of respiratory distress, breathing rate appears normal, no obvious gross SOB, gasping or wheezing  CV: no obvious cyanosis  MS: moves all visible extremities without  noticeable abnormality  PSYCH/NEURO: pleasant and cooperative, no obvious depression or anxiety, speech and thought processing grossly intact  ASSESSMENT AND PLAN:  Discussed the following assessment and plan:  Nasal congestion  Cough  PND (post-nasal drip)  -we discussed possible serious and likely etiologies, options for evaluation and workup, limitations of telemedicine visit vs in person visit, treatment, treatment risks and precautions. Pt prefers to treat via telemedicine empirically rather than in person at this moment.  Query post viral symptoms versus allergic rectitis versus other.  Opted to try Flonase 2 sprays each nostril daily for 2 to 3 weeks, Allegra once daily and Tessalon for cough. Scheduled follow up with PCP offered: Advised to follow-up with his primary care doctor if needed, he agrees to schedule if symptoms worsen, new symptoms arise or symptoms do not improve with treatment. Discussed options for inperson care if PCP office not available. Did let this patient know that I only do telemedicine on Tuesdays and Thursdays for [employer]. Advised to schedule follow up visit with PCP or [facility] if any further questions or concerns to avoid delays in care.   I discussed the assessment and treatment plan with the patient. The patient was provided an opportunity to ask questions and all were answered. The patient agreed with the plan and demonstrated an understanding of the instructions.     Terressa Koyanagi, DO

## 2020-11-30 NOTE — Patient Instructions (Signed)
-  I sent the medication(s) we discussed to your pharmacy: Meds ordered this encounter  Medications  . benzonatate (TESSALON PERLES) 100 MG capsule    Sig: Take 1 capsule (100 mg total) by mouth 3 (three) times daily as needed.    Dispense:  20 capsule    Refill:  0  . fluticasone (FLONASE) 50 MCG/ACT nasal spray    Sig: Place 2 sprays into both nostrils daily.    Dispense:  16 g    Refill:  0   Allegra once daily for 2 to 3 weeks.  I hope you are feeling better soon!  Seek in person care promptly if your symptoms worsen, new concerns arise or you are not improving with treatment.  It was nice to meet you today. I help [employer] out with telemedicine visits on Tuesdays and Thursdays and am available for visits on those days. If you have any concerns or questions following this visit please schedule a follow up visit with your Primary Care doctor or seek care at a local urgent care clinic to avoid delays in care.

## 2020-12-22 ENCOUNTER — Other Ambulatory Visit: Payer: Self-pay | Admitting: Family Medicine

## 2021-03-01 ENCOUNTER — Ambulatory Visit (INDEPENDENT_AMBULATORY_CARE_PROVIDER_SITE_OTHER): Payer: 59 | Admitting: Family Medicine

## 2021-03-01 ENCOUNTER — Other Ambulatory Visit: Payer: Self-pay

## 2021-03-01 VITALS — BP 107/71 | HR 71 | Temp 98.0°F | Ht 69.0 in | Wt 179.0 lb

## 2021-03-01 DIAGNOSIS — Z0001 Encounter for general adult medical examination with abnormal findings: Secondary | ICD-10-CM

## 2021-03-01 DIAGNOSIS — E785 Hyperlipidemia, unspecified: Secondary | ICD-10-CM | POA: Diagnosis not present

## 2021-03-01 DIAGNOSIS — G8929 Other chronic pain: Secondary | ICD-10-CM

## 2021-03-01 DIAGNOSIS — M25511 Pain in right shoulder: Secondary | ICD-10-CM

## 2021-03-01 DIAGNOSIS — R7309 Other abnormal glucose: Secondary | ICD-10-CM | POA: Insufficient documentation

## 2021-03-01 DIAGNOSIS — Z6826 Body mass index (BMI) 26.0-26.9, adult: Secondary | ICD-10-CM

## 2021-03-01 DIAGNOSIS — E663 Overweight: Secondary | ICD-10-CM

## 2021-03-01 LAB — CBC
HCT: 43.7 % (ref 39.0–52.0)
Hemoglobin: 14.8 g/dL (ref 13.0–17.0)
MCHC: 34 g/dL (ref 30.0–36.0)
MCV: 85 fl (ref 78.0–100.0)
Platelets: 221 10*3/uL (ref 150.0–400.0)
RBC: 5.14 Mil/uL (ref 4.22–5.81)
RDW: 14 % (ref 11.5–15.5)
WBC: 5.4 10*3/uL (ref 4.0–10.5)

## 2021-03-01 LAB — COMPREHENSIVE METABOLIC PANEL
ALT: 44 U/L (ref 0–53)
AST: 30 U/L (ref 0–37)
Albumin: 4.6 g/dL (ref 3.5–5.2)
Alkaline Phosphatase: 50 U/L (ref 39–117)
BUN: 11 mg/dL (ref 6–23)
CO2: 25 mEq/L (ref 19–32)
Calcium: 9.6 mg/dL (ref 8.4–10.5)
Chloride: 103 mEq/L (ref 96–112)
Creatinine, Ser: 1.14 mg/dL (ref 0.40–1.50)
GFR: 79.62 mL/min (ref >60.00)
Glucose, Bld: 96 mg/dL (ref 70–99)
Potassium: 4.2 mEq/L (ref 3.5–5.1)
Sodium: 138 mEq/L (ref 135–145)
Total Bilirubin: 1.7 mg/dL — ABNORMAL HIGH (ref 0.2–1.2)
Total Protein: 7.6 g/dL (ref 6.0–8.3)

## 2021-03-01 LAB — LIPID PANEL
Cholesterol: 213 mg/dL — ABNORMAL HIGH (ref 0–200)
HDL: 41.9 mg/dL (ref >39.00)
LDL Cholesterol: 146 mg/dL — ABNORMAL HIGH (ref 0–99)
NonHDL: 171.51
Total CHOL/HDL Ratio: 5
Triglycerides: 130 mg/dL (ref 0.0–149.0)
VLDL: 26 mg/dL (ref 0.0–40.0)

## 2021-03-01 LAB — TSH: TSH: 1.4 u[IU]/mL (ref 0.35–4.50)

## 2021-03-01 LAB — HEMOGLOBIN A1C: Hgb A1c MFr Bld: 5.9 % (ref 4.6–6.5)

## 2021-03-01 NOTE — Patient Instructions (Signed)
It was very nice to see you today!  I will refer you to see a physical therapist for your shoulder.  We will check blood work today.  I will see you back in year.  Come back to see me sooner if needed.  Take care, Dr Jimmey Ralph  PLEASE NOTE:  If you had any lab tests please let us know if you have not heard back within a few days. You may see your results on mychart before we have a chance to review them but we will give you a call once they are reviewed by [place]. If we ordered any referrals today, please let us know if you have not heard from their office within the next week.   Please try these tips to maintain a healthy lifestyle:   Eat at least 3 REAL meals and 1-2 snacks per day.  Aim for no more than 5 hours between eating.  If you eat breakfast, please do so within one hour of getting up.    Each meal should contain half fruits/vegetables, one quarter protein, and one quarter carbs (no bigger than a computer mouse)   Cut down on sweet beverages. This includes juice, soda, and sweet tea.     Drink at least 1 glass of water with each meal and aim for at least 8 glasses per day   Exercise at least 150 minutes every week.    Preventive Care 25-76 Years Old, Male Preventive care refers to lifestyle choices and visits with your health care provider that can promote health and wellness. This includes:  A yearly physical exam. This is also called an annual wellness visit.  Regular dental and eye exams.  Immunizations.  Screening for certain conditions.  Healthy lifestyle choices, such as: ? Eating a healthy diet. ? Getting regular exercise. ? Not using drugs or products that contain nicotine and tobacco. ? Limiting alcohol use. What can I expect for my preventive care visit? Physical exam Your health care provider will check your:  Height and weight. These may be used to calculate your BMI (body mass index). BMI is a measurement that tells if you are at a healthy  weight.  Heart rate and blood pressure.  Body temperature.  Skin for abnormal spots. Counseling Your health care provider may ask you questions about your:  Past medical problems.  Family's medical history.  Alcohol, tobacco, and drug use.  Emotional well-being.  Home life and relationship well-being.  Sexual activity.  Diet, exercise, and sleep habits.  Work and work Astronomer.  Access to firearms. What immunizations do I need? Vaccines are usually given at various ages, according to a schedule. Your health care provider will recommend vaccines for you based on your age, medical history, and lifestyle or other factors, such as travel or where you work.   What tests do I need? Blood tests  Lipid and cholesterol levels. These may be checked every 5 years, or more often if you are over 81 years old.  Hepatitis C test.  Hepatitis B test. Screening  Lung cancer screening. You may have this screening every year starting at age 3 if you have a 30-pack-year history of smoking and currently smoke or have quit within the past 15 years.  Prostate cancer screening. Recommendations will vary depending on your family history and other risks.  Genital exam to check for testicular cancer or hernias.  Colorectal cancer screening. ? All adults should have this screening starting at age 1 and continuing until age  75. ? Your health care provider may recommend screening at age 80 if you are at increased risk. ? You will have tests every 1-10 years, depending on your results and the type of screening test.  Diabetes screening. ? This is done by checking your blood sugar (glucose) after you have not eaten for a while (fasting). ? You may have this done every 1-3 years.  STD (sexually transmitted disease) testing, if you are at risk. Follow these instructions at home: Eating and drinking  Eat a diet that includes fresh fruits and vegetables, whole grains, lean protein, and  low-fat dairy products.  Take vitamin and mineral supplements as recommended by your health care provider.  Do not drink alcohol if your health care provider tells you not to drink.  If you drink alcohol: ? Limit how much you have to 0-2 drinks a day. ? Be aware of how much alcohol is in your drink. In the [place] one drink equals one 12 oz bottle of beer (355 mL), one 5 oz glass of wine (148 mL), or one 1 oz glass of hard liquor (44 mL).   Lifestyle  Take daily care of your teeth and gums. Brush your teeth every morning and night with fluoride toothpaste. Floss one time each day.  Stay active. Exercise for at least 30 minutes 5 or more days each week.  Do not use any products that contain nicotine or tobacco, such as cigarettes, e-cigarettes, and chewing tobacco. If you need help quitting, ask your health care provider.  Do not use drugs.  If you are sexually active, practice safe sex. Use a condom or other form of protection to prevent STIs (sexually transmitted infections).  If told by your health care provider, take low-dose aspirin daily starting at age 42.  Find healthy ways to cope with stress, such as: ? Meditation, yoga, or listening to music. ? Journaling. ? Talking to a trusted person. ? Spending time with friends and family. Safety  Always wear your seat belt while driving or riding in a vehicle.  Do not drive: ? If you have been drinking alcohol. Do not ride with someone who has been drinking. ? When you are tired or distracted. ? While texting.  Wear a helmet and other protective equipment during sports activities.  If you have firearms in your house, make sure you follow all gun safety procedures. What's next?  Go to your health care provider once a year for an annual wellness visit.  Ask your health care provider how often you should have your eyes and teeth checked.  Stay up to date on all vaccines. This information is not intended to replace advice  given to you by your health care provider. Make sure you discuss any questions you have with your health care provider. Document Revised: 06/10/2019 Document Reviewed: 09/05/2018 Elsevier Patient Education  2021 [employer].

## 2021-03-01 NOTE — Assessment & Plan Note (Signed)
Check labs 

## 2021-03-01 NOTE — Progress Notes (Signed)
Chief Complaint:  Leroy Johnson is a 40 y.o. male who presents today for his annual comprehensive physical exam.    Assessment/Plan:  New/Acute Problems: Right shoulder pain Consistent with rotator cuff tendinopathy based on exam.  Will place referral to physical therapy for further management.  Chronic Problems Addressed Today: Dyslipidemia Discussed lifestyle modifications.  Check labs today.  Blood glucose abnormal Check labs.  Body mass index is 26.43 kg/m. / Overweight  BMI Metric Follow Up - 03/01/21 1017      BMI Metric Follow Up-Please document annually   BMI Metric Follow Up Education provided           Preventative Healthcare: Check labs today.  Up-to-date on vaccines.  Patient Counseling(The following topics were reviewed and/or handout was given):  -Nutrition: Stressed importance of moderation in sodium/caffeine intake, saturated fat and cholesterol, caloric balance, sufficient intake of fresh fruits, vegetables, and fiber.  -Stressed the importance of regular exercise.   -Substance Abuse: Discussed cessation/primary prevention of tobacco, alcohol, or other drug use; driving or other dangerous activities under the influence; availability of treatment for abuse.   -Injury prevention: Discussed safety belts, safety helmets, smoke detector, smoking near bedding or upholstery.   -Sexuality: Discussed sexually transmitted diseases, partner selection, use of condoms, avoidance of unintended pregnancy and contraceptive alternatives.   -Dental health: Discussed importance of regular tooth brushing, flossing, and dental visits.  -Health maintenance and immunizations reviewed. Please refer to Health maintenance section.  Return to care in 1 year for next preventative visit.     Subjective:  HPI:  Has had some right shoulder pain for the last year or so. No injuries or obvious precipitating events. Worse with certain motions. No specific treatments tried.    Lifestyle Diet: Balanced. Plenty of fruits and vegetables.  Exercise: Trying to walk more.   Depression screen PHQ 2/9 03/01/2021  Decreased Interest 1  Down, Depressed, Hopeless 1  PHQ - 2 Score 2  Altered sleeping 1  Tired, decreased energy 0  Change in appetite 0  Feeling bad or failure about yourself  0  Trouble concentrating 0  Moving slowly or fidgety/restless 0  Suicidal thoughts 0  PHQ-9 Score 3  Difficult doing work/chores Somewhat difficult    Health Maintenance Due  Topic Date Due  . HIV Screening  Never done  . Hepatitis C Screening  Never done     ROS: Per HPI, otherwise a complete review of systems was negative.   PMH:  The following were reviewed and entered/updated in epic: No past medical history on file. Patient Active Problem List   Diagnosis Date Noted  . Blood glucose abnormal 03/01/2021  . Dyslipidemia 03/01/2021   No past surgical history on file.  Family History  Problem Relation Age of Onset  . Hypertension Mother   . Diabetes Mother   . Diabetes Father     Medications- reviewed and updated No current outpatient medications on file.   No current facility-administered medications for this visit.    Allergies-reviewed and updated No Known Allergies  Social History   Socioeconomic History  . Marital status: Married    Spouse name: Not on file  . Number of children: 1  . Years of education: Not on file  . Highest education level: Not on file  Occupational History  . Occupation: Sport and exercise psychologist  Tobacco Use  . Smoking status: Former Games developer  . Smokeless tobacco: Never Used  Vaping Use  . Vaping Use: Never used  Substance and Sexual  Activity  . Alcohol use: Yes    Comment: Socially/Weekends  . Drug use: No  . Sexual activity: Yes    Partners: Female  Other Topics Concern  . Not on file  Social History Narrative  . Not on file   Social Determinants of Health   Financial Resource Strain: Not on file  Food  Insecurity: Not on file  Transportation Needs: Not on file  Physical Activity: Not on file  Stress: Not on file  Social Connections: Not on file        Objective:  Physical Exam: BP 107/71   Pulse 71   Temp 98 F (36.7 C) (Temporal)   Ht 5\' 9"  (1.753 m)   Wt 179 lb (81.2 kg)   SpO2 100%   BMI 26.43 kg/m   Body mass index is 26.43 kg/m. Wt Readings from Last 3 Encounters:  03/01/21 179 lb (81.2 kg)  02/06/20 169 lb (76.7 kg)  07/15/18 171 lb 2 oz (77.6 kg)   Gen: NAD, resting comfortably HEENT: TMs normal bilaterally. OP clear. No thyromegaly noted.  CV: RRR with no murmurs appreciated Pulm: NWOB, CTAB with no crackles, wheezes, or rhonchi GI: Normal bowel sounds present. Soft, Nontender, Nondistended. MSK: no edema, cyanosis, or clubbing noted.  Right shoulder without deformities.  Weakness with resisted supraspinatus testing on the right.  Normal internal and external rotation on right shoulder.  Positive Neer's test. Skin: warm, dry Neuro: CN2-12 grossly intact. Strength 5/5 in upper and lower extremities. Reflexes symmetric and intact bilaterally.  Psych: Normal affect and thought content     Leroy Johnson M. 07/17/18, MD 03/01/2021 10:17 AM

## 2021-03-01 NOTE — Assessment & Plan Note (Signed)
Discussed lifestyle modifications.  Check labs today.  

## 2021-03-02 NOTE — Progress Notes (Signed)
Please inform patient of the following:  His cholesterol and blood sugar are up a but from last year. We do not need to start medications but he should work on diet and exercise and we can recheck in a year.  Katina Degree. Jimmey Ralph, MD 03/02/2021 10:42 AM

## 2021-03-14 ENCOUNTER — Encounter: Payer: Self-pay | Admitting: Physical Therapy

## 2021-03-14 ENCOUNTER — Ambulatory Visit: Payer: 59 | Attending: Family Medicine | Admitting: Physical Therapy

## 2021-03-14 ENCOUNTER — Other Ambulatory Visit: Payer: Self-pay

## 2021-03-14 DIAGNOSIS — R252 Cramp and spasm: Secondary | ICD-10-CM | POA: Diagnosis present

## 2021-03-14 DIAGNOSIS — M25511 Pain in right shoulder: Secondary | ICD-10-CM | POA: Insufficient documentation

## 2021-03-14 DIAGNOSIS — G8929 Other chronic pain: Secondary | ICD-10-CM | POA: Insufficient documentation

## 2021-03-14 DIAGNOSIS — M25611 Stiffness of right shoulder, not elsewhere classified: Secondary | ICD-10-CM | POA: Diagnosis present

## 2021-03-14 NOTE — Therapy (Signed)
[facility] [address] [place], [place], [address] Phone: [phone]   Fax:  [phone]  Physical Therapy Evaluation  Patient Details  Name: Leroy Johnson MRN: [id number] Date of Birth: 1981-02-09 Referring Provider (PT): Ardith Dark, MD   Encounter Date: 03/14/2021   PT End of Session - 03/14/21 0821     Visit Number 1    Date for PT Re-Evaluation 06/06/21    Authorization Type aetna    PT Start Time 0810   late   PT Stop Time 0845    PT Time Calculation (min) 35 min    Activity Tolerance Patient tolerated treatment well    Behavior During Therapy [facility] for tasks assessed/performed             History reviewed. No pertinent past medical history.  History reviewed. No pertinent surgical history.  There were no vitals filed for this visit.    Subjective Assessment - 03/14/21 0814     Subjective Pt states he had pain starting a couple of years ago that started when playing cricket    Limitations Lifting;House hold activities;Other (comment)    Patient Stated Goals be able to do normal things around the house    Currently in Pain? Yes    Pain Score 7     Pain Location Shoulder    Pain Orientation Right;Anterior    Pain Descriptors / Indicators Sharp    Pain Type Chronic pain    Pain Onset More than a month ago    Pain Frequency Intermittent    Aggravating Factors  raising up and down will have pain for 5-10 minutes, sleeping on Rt side    Pain Relieving Factors not raising    Effect of Pain on Daily Activities playing cricket and lifting heavy objects    Multiple Pain Sites No                [facility] PT Assessment - 03/14/21 0001       Assessment   Medical Diagnosis M25.511,G89.29 (ICD-10-CM) - Chronic right shoulder pain    Referring Provider (PT) Ardith Dark, MD    Onset Date/Surgical Date --   2 years ago   Hand Dominance Right    Prior Therapy No      Precautions    Precautions None      Balance Screen   Has the patient fallen in the past 6 months No      Home Environment   Living Environment Private residence    Living Arrangements Spouse/significant other;Children   son     Prior Function   Level of Independence Independent    Vocation Full time employment    Vocation Requirements desk work      Cognition   Overall Cognitive Status Within Functional Limits for tasks assessed      Posture/Postural Control   Posture/Postural Control Postural limitations    Postural Limitations Rounded Shoulders;Increased thoracic kyphosis   tight pecs and upper traps, elevates shoulders and leans to the left in sitting     ROM / Strength   AROM / PROM / Strength AROM;Strength;PROM      AROM   AROM Assessment Site Shoulder    Right/Left Shoulder Right;Left    Right Shoulder Extension 58 Degrees    Right Shoulder Flexion 148 Degrees    Right Shoulder ABduction 132 Degrees    Right Shoulder Internal Rotation --   HBB to L3   Left Shoulder Extension  60 Degrees    Left Shoulder Flexion 172 Degrees    Left Shoulder ABduction 156 Degrees    Left Shoulder Internal Rotation --   HBB T8     PROM   PROM Assessment Site Shoulder    Right/Left Shoulder Right;Left    Right Shoulder Flexion 150 Degrees   pain   Right Shoulder ABduction 100 Degrees   pain   Right Shoulder Internal Rotation 50 Degrees    Right Shoulder External Rotation 100 Degrees      Strength   Overall Strength Comments bil 5/5 and no pain      Palpation   Palpation comment pain subacromial; pain along right upper trap; pain and tight anterior delt right side      Special Tests   Other special tests positive neer test and hawkins kennedy Rt side                        Objective measurements completed on examination: See above findings.       [facility] Adult PT Treatment/Exercise - 03/14/21 0001       Self-Care   Self-Care Other Self-Care Comments    Other Self-Care  Comments  dry needling info and sitting posture                      PT Short Term Goals - 03/14/21 1001       PT SHORT TERM GOAL #1   Title Pt will demonstrate improved sitting posture and be able to sit in correct upright posture without cues    Time 12    Period Weeks    Status New    Target Date 04/11/21      PT SHORT TERM GOAL #2   Title Pt will report 20% less pain    Time 4    Period Weeks    Status New    Target Date 04/11/21               PT Long Term Goals - 03/14/21 0948       PT LONG TERM GOAL #1   Title Pt will report pain is reduced to no greater than 3/10 with overhead reaching throughout the day    Baseline 7/10    Time 12    Period Weeks    Status New    Target Date 06/06/21      PT LONG TERM GOAL #2   Title Pt will be able to sleep on his right side without pain    Time 12    Period Weeks    Status New    Target Date 06/06/21      PT LONG TERM GOAL #3   Title Pt will score 73 on FOTO demonstrating improved function    Baseline 63    Time 12    Period Weeks    Status New    Target Date 06/06/21                    Plan - 03/14/21 1005     Clinical Impression Statement Pt presents to skilled PT today due to Rt shoulder pain that has been ongoing for the past 2 years.  Pt was playing cricket regularly and that is when the pain began, but then he stopped playing and it has not gone away.  Pt has pain levels of 7/10 with overhead reaching and it lasts 5-10 minutes.  Pain when  sleeping on the right side and pain during the day when sitting at the computer for a long time.  Pt demonstrates AROM limited by pain and stiffness in flexion, abduction and internal rotation.  Pt has 5/5 MMT without pain bil shoulders.  He has excessive ER and decreased IR PROM on the right side.  Pt has tight muscles and spasming upper trap and anterior deltoid on the Rt side.  Pain was reproduced with palpation to trigger point in upper trap on the  right side.  Pt will benefit from skilled PT to address impairments so he can return to full function without pain.    Personal Factors and Comorbidities Time since onset of injury/illness/exacerbation    Stability/Clinical Decision Making Stable/Uncomplicated    Clinical Decision Making Low    Rehab Potential Excellent    PT Frequency 1x / week    PT Duration 12 weeks    PT Treatment/Interventions ADLs/Self Care Home Management;Cryotherapy;Electrical Stimulation;Iontophoresis 4mg /ml Dexamethasone;Moist Heat;Therapeutic activities;Therapeutic exercise;Neuromuscular re-education;Manual techniques;Patient/family education;Passive range of motion;Dry needling;Taping    PT Home Exercise Plan gave info for dry needling and desk posture    Consulted and Agree with Plan of Care Patient             Patient will benefit from skilled therapeutic intervention in order to improve the following deficits and impairments:  Pain, Postural dysfunction, Increased muscle spasms, Increased fascial restricitons, Impaired flexibility  Visit Diagnosis: Chronic right shoulder pain - Plan: PT plan of care cert/re-cert  Cramp and spasm - Plan: PT plan of care cert/re-cert  Stiffness of right shoulder, not elsewhere classified - Plan: PT plan of care cert/re-cert     Problem List Patient Active Problem List   Diagnosis Date Noted   Blood glucose abnormal 03/01/2021   Dyslipidemia 03/01/2021    05/01/2021, PT 03/14/2021, 12:30 PM  [facility] [address], [address] [place], [place], [place] Phone: [phone]   Fax:  [phone]  Name: Leroy Johnson MRN: Halford Chessman Date of Birth: 17-Mar-1981

## 2021-03-14 NOTE — Therapy (Signed)
[facility] [address], [address] [place], [place], [address] Phone: [phone]   Fax:  [phone]  Physical Therapy Evaluation  Patient Details  Name: Leroy Johnson MRN: [id number] Date of Birth: [birth year] No data recorded  Encounter Date: 03/14/2021   PT End of Session - 03/14/21 0821     Visit Number 1    Date for PT Re-Evaluation 06/06/21    Authorization Type aetna    PT Start Time 0810   late   PT Stop Time 0845    PT Time Calculation (min) 35 min    Activity Tolerance Patient tolerated treatment well    Behavior During Therapy [facility] for tasks assessed/performed             History reviewed. No pertinent past medical history.  History reviewed. No pertinent surgical history.  There were no vitals filed for this visit.    Subjective Assessment - 03/14/21 0814     Subjective Pt states he had pain starting a couple of years ago that started when playing cricket    Limitations Lifting;House hold activities;Other (comment)    Patient Stated Goals be able to do normal things around the house    Currently in Pain? Yes    Pain Score 7     Pain Location Shoulder    Pain Orientation Right;Anterior    Pain Descriptors / Indicators Sharp    Pain Type Chronic pain    Pain Onset More than a month ago    Pain Frequency Intermittent    Aggravating Factors  raising up and down will have pain for 5-10 minutes, sleeping on Rt side    Pain Relieving Factors not raising    Effect of Pain on Daily Activities playing cricket and lifting heavy objects    Multiple Pain Sites No                [facility] PT Assessment - 03/14/21 0001       Balance Screen   Has the patient fallen in the past 6 months No      Home Environment   Living Environment Private residence    Living Arrangements Spouse/significant other;Children   son     Prior Function   Level of Independence Independent    Vocation Full time  employment    Vocation Requirements desk work      Cognition   Overall Cognitive Status Within Functional Limits for tasks assessed      Posture/Postural Control   Posture/Postural Control Postural limitations    Postural Limitations Rounded Shoulders;Increased thoracic kyphosis   tight pecs and upper traps, elevates shoulders and leans to the left in sitting     ROM / Strength   AROM / PROM / Strength AROM;Strength;PROM      AROM   AROM Assessment Site Shoulder    Right/Left Shoulder Right;Left    Right Shoulder Extension 58 Degrees    Right Shoulder Flexion 148 Degrees    Right Shoulder ABduction 132 Degrees    Right Shoulder Internal Rotation --   HBB to L3   Left Shoulder Extension 60 Degrees    Left Shoulder Flexion 172 Degrees    Left Shoulder ABduction 156 Degrees    Left Shoulder Internal Rotation --   HBB T8     PROM   PROM Assessment Site Shoulder    Right/Left Shoulder Right;Left    Right Shoulder Flexion 150 Degrees   pain   Right Shoulder ABduction 100 Degrees  pain   Right Shoulder Internal Rotation 50 Degrees    Right Shoulder External Rotation 100 Degrees      Strength   Overall Strength Comments bil 5/5 and no pain      Palpation   Palpation comment pain subacromial; pain along right upper trap; pain and tight anterior delt right side      Special Tests   Other special tests positive neer test and hawkins kennedy Rt side                        Objective measurements completed on examination: See above findings.       [facility] Adult PT Treatment/Exercise - 03/14/21 0001       Self-Care   Self-Care Other Self-Care Comments    Other Self-Care Comments  dry needling info and sitting posture                      PT Short Term Goals - 03/14/21 1001       PT SHORT TERM GOAL #1   Title Pt will demonstrate improved sitting posture and be able to sit in correct upright posture without cues    Time 12    Period Weeks     Status New    Target Date 04/11/21      PT SHORT TERM GOAL #2   Title Pt will report 20% less pain    Time 4    Period Weeks    Status New    Target Date 04/11/21               PT Long Term Goals - 03/14/21 0948       PT LONG TERM GOAL #1   Title Pt will report pain is reduced to no greater than 3/10 with overhead reaching throughout the day    Baseline 7/10    Time 12    Period Weeks    Status New    Target Date 06/06/21      PT LONG TERM GOAL #2   Title Pt will be able to sleep on his right side without pain    Time 12    Period Weeks    Status New    Target Date 06/06/21      PT LONG TERM GOAL #3   Title Pt will score 73 on FOTO demonstrating improved function    Baseline 63    Time 12    Period Weeks    Status New    Target Date 06/06/21                    Plan - 03/14/21 1005     Clinical Impression Statement Pt presents to skilled PT today due to Rt shoulder pain that has been ongoing for the past 2 years.  Pt was playing cricket regularly and that is when the pain began, but then he stopped playing and it has not gone away.  Pt has pain levels of 7/10 with overhead reaching and it lasts 5-10 minutes.  Pain when sleeping on the right side and pain during the day when sitting at the computer for a long time.  Pt demonstrates AROM limited by pain and stiffness in flexion, abduction and internal rotation.  Pt has 5/5 MMT without pain bil shoulders.  He has excessive ER and decreased IR PROM on the right side.  Pt has tight muscles and spasming upper trap and anterior  deltoid on the Rt side.  Pain was reproduced with palpation to trigger point in upper trap on the right side.  Pt will benefit from skilled PT to address impairments so he can return to full function without pain.    Personal Factors and Comorbidities Time since onset of injury/illness/exacerbation    Stability/Clinical Decision Making Stable/Uncomplicated    Clinical Decision Making Low     Rehab Potential Excellent    PT Frequency 1x / week    PT Duration 12 weeks    PT Treatment/Interventions ADLs/Self Care Home Management;Cryotherapy;Electrical Stimulation;Iontophoresis 4mg /ml Dexamethasone;Moist Heat;Therapeutic activities;Therapeutic exercise;Neuromuscular re-education;Manual techniques;Patient/family education;Passive range of motion;Dry needling;Taping    PT Home Exercise Plan gave info for dry needling and desk posture    Consulted and Agree with Plan of Care Patient             Patient will benefit from skilled therapeutic intervention in order to improve the following deficits and impairments:  Pain, Postural dysfunction, Increased muscle spasms, Increased fascial restricitons, Impaired flexibility  Visit Diagnosis: Chronic right shoulder pain  Cramp and spasm  Stiffness of right shoulder, not elsewhere classified     Problem List Patient Active Problem List   Diagnosis Date Noted   Blood glucose abnormal 03/01/2021   Dyslipidemia 03/01/2021    05/01/2021, PT 03/14/2021, 12:24 PM  [facility] [address], [address] [place], [place], [place] Phone: [phone]   Fax:  [phone]  Name: Leroy Johnson MRN: Halford Chessman Date of Birth: [birth year]

## 2021-03-21 ENCOUNTER — Other Ambulatory Visit: Payer: Self-pay

## 2021-03-21 ENCOUNTER — Ambulatory Visit: Payer: 59 | Admitting: Physical Therapy

## 2021-03-21 DIAGNOSIS — G8929 Other chronic pain: Secondary | ICD-10-CM

## 2021-03-21 DIAGNOSIS — M25511 Pain in right shoulder: Secondary | ICD-10-CM | POA: Diagnosis not present

## 2021-03-21 DIAGNOSIS — R252 Cramp and spasm: Secondary | ICD-10-CM

## 2021-03-21 DIAGNOSIS — M25611 Stiffness of right shoulder, not elsewhere classified: Secondary | ICD-10-CM

## 2021-03-21 NOTE — Patient Instructions (Signed)
Access Code: [email or web address] URL: [email or web address] Date: 03/21/2021 Prepared by: Anabel Halon  Exercises Corner Pec Major Stretch - 2 x daily - 7 x weekly - 1 sets - 2 reps - 20-30s hold Staggered Lat Pull Down with Resistance - Elbows Bent - 1 x daily - 7 x weekly - 3 sets - 10 reps Wall Clock with Theraband - 1 x daily - 7 x weekly - 1 sets - 10 reps

## 2021-03-21 NOTE — Therapy (Signed)
[facility] [address] [place], [place], [address] Phone: [phone]   Fax:  [phone]  Physical Therapy Treatment  Patient Details  Name: Leroy Johnson MRN: [id number] Date of Birth: 1981/07/12 Referring Provider (PT): Ardith Dark, MD   Encounter Date: 03/21/2021   PT End of Session - 03/21/21 1708     Visit Number 2    Date for PT Re-Evaluation 06/06/21    Authorization Type aetna    PT Start Time 1618    PT Stop Time 1700    PT Time Calculation (min) 42 min    Activity Tolerance Patient tolerated treatment well    Behavior During Therapy [facility] for tasks assessed/performed             No past medical history on file.  No past surgical history on file.  There were no vitals filed for this visit.   Subjective Assessment - 03/21/21 1705     Subjective Continues to have pain when reaching overhead    Limitations Lifting;House hold activities;Other (comment)    Patient Stated Goals be able to do normal things around the house    Currently in Pain? No/denies    Pain Onset More than a month ago                [facility] PT Assessment - 03/21/21 0001       AROM   Right Shoulder Flexion 168 Degrees   139 at start of session   Right Shoulder ABduction 165 Degrees   152 at start of session                          [facility] Adult PT Treatment/Exercise - 03/21/21 0001       Shoulder Exercises: Standing   Protraction 12 reps    Protraction Weight (lbs) green loop at wrists; forearm slide for serratus    Other Standing Exercises wall clocks; blue loop; x10 Rt UE      Shoulder Exercises: ROM/Strengthening   Lat Pull Limitations green tband; Rt UE; x15 reps    Wall Pushups 15 reps    Wall Pushups Limitations VC for decreased cervical protraction      Shoulder Exercises: Stretch   Corner Stretch 1 rep;20 seconds      Manual Therapy   Manual Therapy Soft tissue mobilization     Soft tissue mobilization skilled palpation and assessment of tissues before, during, and after dry needling              Trigger Point Dry Needling - 03/21/21 0001     Consent Given? Yes    Education Handout Provided Previously provided    Muscles Treated Head and Neck Upper trapezius;Levator scapulae    Muscles Treated Upper Quadrant Supraspinatus;Infraspinatus    Dry Needling Comments Rt only    Upper Trapezius Response Twitch reponse elicited;Palpable increased muscle length    Levator Scapulae Response Twitch response elicited;Palpable increased muscle length    Supraspinatus Response Twitch response elicited;Palpable increased muscle length    Infraspinatus Response Palpable increased muscle length                  PT Education - 03/21/21 1705     Education Details corner pec stretch; band lat pull down green tband; wall circles yellow tband    Person(s) Educated Patient    Methods Explanation;Demonstration;Tactile cues;Verbal cues;Handout    Comprehension Verbalized understanding;Returned demonstration;Verbal cues required;Tactile cues  required              PT Short Term Goals - 03/21/21 1707       PT SHORT TERM GOAL #1   Title Pt will demonstrate improved sitting posture and be able to sit in correct upright posture without cues    Time 12    Period Weeks    Status On-going    Target Date 04/11/21      PT SHORT TERM GOAL #3   Title Patient will be independent with HEP for continued progression at home.    Time 4    Period Weeks    Status New    Target Date 04/18/21               PT Long Term Goals - 03/14/21 0948       PT LONG TERM GOAL #1   Title Pt will report pain is reduced to no greater than 3/10 with overhead reaching throughout the day    Baseline 7/10    Time 12    Period Weeks    Status New    Target Date 06/06/21      PT LONG TERM GOAL #2   Title Pt will be able to sleep on his right side without pain    Time 12     Period Weeks    Status New    Target Date 06/06/21      PT LONG TERM GOAL #3   Title Pt will score 73 on FOTO demonstrating improved function    Baseline 63    Time 12    Period Weeks    Status New    Target Date 06/06/21                   Plan - 03/21/21 1706     Clinical Impression Statement Patient demonstrates improved shoulder flexion and abduction AROM at end of session. Significant twitch response elicited with dry needling to Rt upper trap, levator scap, and supraspinatus. Would benefit from continued skilled intervention for improved functional use of Rt UE.    Personal Factors and Comorbidities Time since onset of injury/illness/exacerbation    Rehab Potential Excellent    PT Frequency 1x / week    PT Duration 12 weeks    PT Treatment/Interventions ADLs/Self Care Home Management;Cryotherapy;Electrical Stimulation;Iontophoresis 4mg /ml Dexamethasone;Moist Heat;Therapeutic activities;Therapeutic exercise;Neuromuscular re-education;Manual techniques;Patient/family education;Passive range of motion;Dry needling;Taping    PT Home Exercise Plan review HEP; assess response to DN; continue scapular mechanics    Consulted and Agree with Plan of Care Patient             Patient will benefit from skilled therapeutic intervention in order to improve the following deficits and impairments:  Pain, Postural dysfunction, Increased muscle spasms, Increased fascial restricitons, Impaired flexibility  Visit Diagnosis: Chronic right shoulder pain  Cramp and spasm  Stiffness of right shoulder, not elsewhere classified     Problem List Patient Active Problem List   Diagnosis Date Noted   Blood glucose abnormal 03/01/2021   Dyslipidemia 03/01/2021    05/01/2021 PT, DPT  03/21/21 5:14 PM   [facility] [address] [place], [place], [place] Phone: [phone]   Fax:  [phone]  Name: Donley Harland MRN: Halford Chessman Date of Birth: Feb 06, 1981

## 2021-03-29 ENCOUNTER — Ambulatory Visit: Payer: 59 | Attending: Family Medicine | Admitting: Physical Therapy

## 2021-03-29 ENCOUNTER — Other Ambulatory Visit: Payer: Self-pay

## 2021-03-29 DIAGNOSIS — R252 Cramp and spasm: Secondary | ICD-10-CM | POA: Insufficient documentation

## 2021-03-29 DIAGNOSIS — M25611 Stiffness of right shoulder, not elsewhere classified: Secondary | ICD-10-CM | POA: Diagnosis present

## 2021-03-29 DIAGNOSIS — M25511 Pain in right shoulder: Secondary | ICD-10-CM | POA: Diagnosis present

## 2021-03-29 DIAGNOSIS — G8929 Other chronic pain: Secondary | ICD-10-CM | POA: Diagnosis present

## 2021-03-29 NOTE — Patient Instructions (Signed)
Access Code: [email or web address] URL: [email or web address] Date: 03/29/2021 Prepared by: Lavinia Sharps  Exercises Standing Bilateral Low Shoulder Row with Anchored Resistance (Mirrored) - 1 x daily - 7 x weekly - 2 sets - 10 reps Single Arm Shoulder Extension with Anchored Resistance - 1 x daily - 7 x weekly - 2 sets - 10 reps

## 2021-03-29 NOTE — Therapy (Signed)
[facility] [address], [address] [place], [place], [address] Phone: [phone]   Fax:  [phone]  Physical Therapy Treatment  Patient Details  Name: Leroy Johnson MRN: [id number] Date of Birth: 04/03/1981 Referring Provider (PT): Ardith Dark, MD   Encounter Date: 03/29/2021   PT End of Session - 03/29/21 1709     Visit Number 3    Date for PT Re-Evaluation 06/06/21    Authorization Type aetna    PT Start Time 1625   late   PT Stop Time 1700    PT Time Calculation (min) 35 min    Activity Tolerance Patient limited by pain             No past medical history on file.  No past surgical history on file.  There were no vitals filed for this visit.   Subjective Assessment - 03/29/21 1626     Subjective I had some extra pain for 2 days after the DN.  The ex's on the wall (clocks) is painful.  I did them earlier and it's now hurting.    Limitations Lifting;House hold activities;Other (comment)    Patient Stated Goals be able to do normal things around the house    Currently in Pain? Yes    Pain Score 7     Pain Orientation Right    Pain Type Chronic pain                               [facility] Adult PT Treatment/Exercise - 03/29/21 0001       Self-Care   Other Self-Care Comments  discussed ice and pain expectations (pain level 5/10 or less); hold on wall clocks secondary to inc pain      Shoulder Exercises: Supine   Protraction AROM;Right;5 reps    Protraction Limitations painful      Shoulder Exercises: Prone   Extension AROM;Right;10 reps    Extension Limitations painful    Horizontal ABduction 1 AROM;Right;10 reps    Horizontal ABduction 1 Limitations painful      Shoulder Exercises: Sidelying   External Rotation Right;Strengthening;AROM    External Rotation Limitations painful      Shoulder Exercises: Standing   Extension Strengthening;Right;10 reps;Theraband     Theraband Level (Shoulder Extension) Level 2 (Red)    Row Right;Strengthening;10 reps;Theraband    Theraband Level (Shoulder Row) Level 2 (Red)    Other Standing Exercises 2# weight on pulley with 2 sets of 10 of counter weight: flexion, scaption and abduction for pain modulation      Manual Therapy   Soft tissue mobilization glenohumeral joint distraction, inferior and posterior mobs with and without movements 10x each; pain at endrange not midrange    Kinesiotex Facilitate Muscle      Kinesiotix   Facilitate Muscle  deltoid support:2 vertical strips, 1 horizontal for pain control                    PT Education - 03/29/21 1702     Education Details green band row, green band extension    Person(s) Educated Patient    Methods Explanation;Demonstration;Handout    Comprehension Returned demonstration;Verbalized understanding              PT Short Term Goals - 03/21/21 1707       PT SHORT TERM GOAL #1   Title Pt will demonstrate improved sitting posture and be able  to sit in correct upright posture without cues    Time 12    Period Weeks    Status On-going    Target Date 04/11/21      PT SHORT TERM GOAL #3   Title Patient will be independent with HEP for continued progression at home.    Time 4    Period Weeks    Status New    Target Date 04/18/21               PT Long Term Goals - 03/14/21 0948       PT LONG TERM GOAL #1   Title Pt will report pain is reduced to no greater than 3/10 with overhead reaching throughout the day    Baseline 7/10    Time 12    Period Weeks    Status New    Target Date 06/06/21      PT LONG TERM GOAL #2   Title Pt will be able to sleep on his right side without pain    Time 12    Period Weeks    Status New    Target Date 06/06/21      PT LONG TERM GOAL #3   Title Pt will score 73 on FOTO demonstrating improved function    Baseline 63    Time 12    Period Weeks    Status New    Target Date 06/06/21                    Plan - 03/29/21 1710     Clinical Impression Statement The patient reports moderate to severe shoulder pain although active ROM much improved (168 degrees) on arrival.  Treatment focus on pain modulation and activation of scapular muscles/shoulder retraction and depression.   Pain remains at 7/10 throughout session .  Discussed exercise parameters and pain as well as use of cold pack for pain control.  Recommend holding wall ex's for now until pain under control.  Therapist monitoring response and modifying treatment accordingly.    Rehab Potential Excellent    PT Frequency 1x / week    PT Duration 12 weeks    PT Treatment/Interventions ADLs/Self Care Home Management;Cryotherapy;Electrical Stimulation;Iontophoresis 4mg /ml Dexamethasone;Moist Heat;Therapeutic activities;Therapeutic exercise;Neuromuscular re-education;Manual techniques;Patient/family education;Passive range of motion;Dry needling;Taping    PT Home Exercise Plan review band rows and extensions; assess response to KT tape; pulleys with contra weight; try UE Ranger             Patient will benefit from skilled therapeutic intervention in order to improve the following deficits and impairments:  Pain, Postural dysfunction, Increased muscle spasms, Increased fascial restricitons, Impaired flexibility  Visit Diagnosis: Chronic right shoulder pain  Cramp and spasm  Stiffness of right shoulder, not elsewhere classified     Problem List Patient Active Problem List   Diagnosis Date Noted   Blood glucose abnormal 03/01/2021   Dyslipidemia 03/01/2021   05/01/2021, PT 03/29/21 5:20 PM Phone: [phone] Fax: [phone]  [phone] 03/29/2021, 5:20 PM  [facility] [address], [address] [place], [place], [place] Phone: [phone]   Fax:  [phone]  Name: Leroy Johnson MRN: Halford Chessman Date of Birth: 02-Jul-1981

## 2021-04-05 ENCOUNTER — Other Ambulatory Visit: Payer: Self-pay

## 2021-04-05 ENCOUNTER — Ambulatory Visit: Payer: 59 | Admitting: Physical Therapy

## 2021-04-05 DIAGNOSIS — M25611 Stiffness of right shoulder, not elsewhere classified: Secondary | ICD-10-CM

## 2021-04-05 DIAGNOSIS — M25511 Pain in right shoulder: Secondary | ICD-10-CM

## 2021-04-05 DIAGNOSIS — G8929 Other chronic pain: Secondary | ICD-10-CM

## 2021-04-05 DIAGNOSIS — R252 Cramp and spasm: Secondary | ICD-10-CM

## 2021-04-05 NOTE — Therapy (Signed)
[facility] [address], [address] [place], [place], [address] Phone: [phone]   Fax:  [phone]  Physical Therapy Treatment  Patient Details  Name: Tayte Mcwherter MRN: [id number] Date of Birth: 1980-10-27 Referring Provider (PT): Ardith Dark, MD   Encounter Date: 04/05/2021   PT End of Session - 04/05/21 1700     Visit Number 4    Date for PT Re-Evaluation 06/06/21    Authorization Type aetna    PT Start Time 1617    PT Stop Time 1655    PT Time Calculation (min) 38 min    Activity Tolerance Patient tolerated treatment well             No past medical history on file.  No past surgical history on file.  There were no vitals filed for this visit.   Subjective Assessment - 04/05/21 1618     Subjective It's the same.  No pain at rest but with (endrange flexion and internal rotation) ROM it will be painful.    Currently in Pain? No/denies    Pain Score 0-No pain    Pain Location Shoulder    Pain Orientation Right                [facility] PT Assessment - 04/05/21 0001       AROM   Right Shoulder Flexion 168 Degrees     Right Shoulder ABduction 165 Degrees                            [facility] Adult PT Treatment/Exercise - 04/05/21 0001       Shoulder Exercises: Standing   Extension Right;20 reps    Theraband Level (Shoulder Extension) Level 3 (Green)    Extension Limitations 3 ways: straight, 45 degrees, 90 degrees 10x each      Shoulder Exercises: ROM/Strengthening   UBE (Upper Arm Bike) 4 min alternating directions    Lat Pull 20 reps    Lat Pull Limitations 25#    Ball on Wall circles each way; also scaption 10x each way    Other ROM/Strengthening Exercises counter push ups 15x   tried lower table but form not as good   Other ROM/Strengthening Exercises tried standing upright row but better form with kneeling on bench row green band 20x                       PT Short Term Goals - 03/21/21 1707       PT SHORT TERM GOAL #1   Title Pt will demonstrate improved sitting posture and be able to sit in correct upright posture without cues    Time 12    Period Weeks    Status On-going    Target Date 04/11/21      PT SHORT TERM GOAL #3   Title Patient will be independent with HEP for continued progression at home.    Time 4    Period Weeks    Status New    Target Date 04/18/21               PT Long Term Goals - 03/14/21 0948       PT LONG TERM GOAL #1   Title Pt will report pain is reduced to no greater than 3/10 with overhead reaching throughout the day    Baseline 7/10    Time 12    Period Weeks  Status New    Target Date 06/06/21      PT LONG TERM GOAL #2   Title Pt will be able to sleep on his right side without pain    Time 12    Period Weeks    Status New    Target Date 06/06/21      PT LONG TERM GOAL #3   Title Pt will score 73 on FOTO demonstrating improved function    Baseline 63    Time 12    Period Weeks    Status New    Target Date 06/06/21                   Plan - 04/05/21 1700     Clinical Impression Statement Although the patient rates his shoulder pain as the "same" he is able to perform and tolerate exercises much better today compared to his last visit.  Verbal and tactile cues to activate lat muscles and to limit compensatory strategies (flexed wrist).  Therapist modifying ex positions for best technique.  No increase in pain reported during or following treatment session.    Personal Factors and Comorbidities Time since onset of injury/illness/exacerbation    Rehab Potential Excellent    PT Frequency 1x / week    PT Duration 12 weeks    PT Treatment/Interventions ADLs/Self Care Home Management;Cryotherapy;Electrical Stimulation;Iontophoresis 4mg /ml Dexamethasone;Moist Heat;Therapeutic activities;Therapeutic exercise;Neuromuscular re-education;Manual techniques;Patient/family  education;Passive range of motion;Dry needling;Taping    PT Home Exercise Plan review HEP as needed;  lat bar; closed chain/weight bearing strengthening; remeasure ROM    Consulted and Agree with Plan of Care Patient             Patient will benefit from skilled therapeutic intervention in order to improve the following deficits and impairments:  Pain, Postural dysfunction, Increased muscle spasms, Increased fascial restricitons, Impaired flexibility  Visit Diagnosis: Chronic right shoulder pain  Cramp and spasm  Stiffness of right shoulder, not elsewhere classified     Problem List Patient Active Problem List   Diagnosis Date Noted   Blood glucose abnormal 03/01/2021   Dyslipidemia 03/01/2021   05/01/2021, PT 04/05/21 5:06 PM Phone: [phone] Fax: [phone]  [phone] 04/05/2021, 5:05 PM  [facility] [address] [place], [place], [place] Phone: [phone]   Fax:  [phone]  Name: Jourdin Connors MRN: Halford Chessman Date of Birth: [birth year]

## 2021-04-12 ENCOUNTER — Ambulatory Visit: Payer: 59 | Admitting: Physical Therapy

## 2021-04-12 ENCOUNTER — Telehealth: Payer: Self-pay | Admitting: Physical Therapy

## 2021-04-12 NOTE — Telephone Encounter (Signed)
Patient forgot he had an appt today.  Reminded of next appt on Tues 7/26.  He plans to attend.

## 2021-04-19 ENCOUNTER — Other Ambulatory Visit: Payer: Self-pay

## 2021-04-19 ENCOUNTER — Ambulatory Visit: Payer: 59 | Admitting: Physical Therapy

## 2021-04-19 DIAGNOSIS — M25611 Stiffness of right shoulder, not elsewhere classified: Secondary | ICD-10-CM

## 2021-04-19 DIAGNOSIS — M25511 Pain in right shoulder: Secondary | ICD-10-CM | POA: Diagnosis not present

## 2021-04-19 DIAGNOSIS — G8929 Other chronic pain: Secondary | ICD-10-CM

## 2021-04-19 DIAGNOSIS — R252 Cramp and spasm: Secondary | ICD-10-CM

## 2021-04-19 NOTE — Patient Instructions (Signed)
Access Code: [email or web address] URL: [email or web address] Date: 04/19/2021 Prepared by: Lavinia Sharps  Exercises Standing Bilateral Low Shoulder Row with Anchored Resistance (Mirrored) - 1 x daily - 7 x weekly - 2 sets - 10 reps Single Arm Shoulder Extension with Anchored Resistance - 1 x daily - 7 x weekly - 3 sets - 10 reps Push ups on kitchen counter - 1 x daily - 7 x weekly - 1 sets - 10 reps Standing Wall [employer] with Mini Swiss Ball - 1 x daily - 7 x weekly - 2 sets - 10 reps Standing Wall Ball Circles in Scaption with Mini Swiss Ball - 1 x daily - 7 x weekly - 2 sets - 10 reps Standing Bent Over Shoulder Row with Resistance - 1 x daily - 7 x weekly - 2 sets - 10 reps Modified Push Up on Knees - 1 x daily - 7 x weekly - 1 sets - 10 reps Shoulder Flexion Wall Slide with Resistance Band - 1 x daily - 7 x weekly - 1 sets - 10 reps

## 2021-04-19 NOTE — Therapy (Signed)
[facility] [address], [place], [place], [address] Phone: [phone]   Fax:  [phone]  Physical Therapy Treatment  Patient Details  Name: Leroy Johnson MRN: [id number] Date of Birth: [birth year] Referring Provider (PT): Vivi Barrack, MD   Encounter Date: 04/19/2021   PT End of Session - 04/19/21 1709     Visit Number 5    Date for PT Re-Evaluation 06/06/21    Authorization Type aetna    PT Start Time 9702    PT Stop Time 1700    PT Time Calculation (min) 39 min    Activity Tolerance Patient tolerated treatment well             No past medical history on file.  No past surgical history on file.  There were no vitals filed for this visit.   Subjective Assessment - 04/19/21 1628     Subjective Some pain sleeping on that side.  No pain at rest right now. Pain with movement.    Currently in Pain? No/denies    Pain Score 0-No pain    Pain Location Shoulder    Pain Orientation Right                [facility] PT Assessment - 04/19/21 0001       AROM   Right Shoulder Flexion 175 Degrees   pain at endrange   Right Shoulder ABduction 175 Degrees   pain at endrange                          [facility] Adult PT Treatment/Exercise - 04/19/21 0001       Shoulder Exercises: Prone   Horizontal ABduction 1 Right;10 reps;Strengthening    Horizontal ABduction 1 Weight (lbs) 2    Other Prone Exercises quadruped WB on right with left 5# weight moving 2x 10    Other Prone Exercises modified push ups on knees 10x      Shoulder Exercises: Standing   Extension Right;20 reps    Theraband Level (Shoulder Extension) Level 3 (Green)    Extension Limitations 3 ways: straight, 45 degrees, 90 degrees 10x each    Other Standing Exercises 2# and 5# snatch and press overhead 2 sets of 7    Other Standing Exercises light green loop wall scoops 10x      Shoulder Exercises: ROM/Strengthening   Lat  Pull 20 reps    Lat Pull Limitations 25#    Wall Pushups 10 reps    Other ROM/Strengthening Exercises kneeling row green band 2x 10    Other ROM/Strengthening Exercises green band row and external rotation   difficulty with getting form down     Shoulder Exercises: Power [employer]   Other Power Tower Exercises 25# rows 15x                    PT Education - 04/19/21 1704     Education Details Modified push ups; wall scoops yellow band    Person(s) Educated Patient    Methods Explanation;Demonstration;Handout    Comprehension Returned demonstration;Verbalized understanding              PT Short Term Goals - 04/19/21 1713       PT SHORT TERM GOAL #1   Title Pt will demonstrate improved sitting posture and be able to sit in correct upright posture without cues    Status Achieved      PT SHORT  TERM GOAL #2   Title Pt will report 20% less pain    Status Achieved      PT SHORT TERM GOAL #3   Title Patient will be independent with HEP for continued progression at home.    Status Achieved               PT Long Term Goals - 03/14/21 0948       PT LONG TERM GOAL #1   Title Pt will report pain is reduced to no greater than 3/10 with overhead reaching throughout the day    Baseline 7/10    Time 12    Period Weeks    Status New    Target Date 06/06/21      PT LONG TERM GOAL #2   Title Pt will be able to sleep on his right side without pain    Time 12    Period Weeks    Status New    Target Date 06/06/21      PT LONG TERM GOAL #3   Title Pt will score 73 on FOTO demonstrating improved function    Baseline 63    Time 12    Period Weeks    Status New    Target Date 06/06/21                   Plan - 04/19/21 1709     Clinical Impression Statement The patient reports pain only when he demonstrates pain at endrange flexion/scaption at > 175 degrees.  No pain with loaded presses overhead or with UE weight bearing.  Verbal and tactile cues to  decrease compensatory shoulder shrug and to engage periscapular muscles and lats.  He expresses interest in going to the gym and we discussed an expected good response to the lat bar and seated rows.  Therapist monitoring for pain with all ex's.  All STGs met.    Rehab Potential Excellent    PT Frequency 1x / week    PT Duration 12 weeks    PT Treatment/Interventions ADLs/Self Care Home Management;Cryotherapy;Electrical Stimulation;Iontophoresis 55m/ml Dexamethasone;Moist Heat;Therapeutic activities;Therapeutic exercise;Neuromuscular re-education;Manual techniques;Patient/family education;Passive range of motion;Dry needling;Taping    PT Home Exercise Plan review HEP as needed;  lat bar; closed chain/weight bearing strengthening             Patient will benefit from skilled therapeutic intervention in order to improve the following deficits and impairments:  Pain, Postural dysfunction, Increased muscle spasms, Increased fascial restricitons, Impaired flexibility  Visit Diagnosis: Chronic right shoulder pain  Cramp and spasm  Stiffness of right shoulder, not elsewhere classified     Problem List Patient Active Problem List   Diagnosis Date Noted   Blood glucose abnormal 03/01/2021   Dyslipidemia 03/01/2021   SRuben Im PT 04/19/21 5:14 PM Phone: [phone]: [phone] SAlvera Singh7/26/2022, 5:14 PM  [facility] [address] [place] [address]Phone: [phone]  Fax:  [phone] Name: Leroy GellnerMRN: [id number] of Birth: 6February 16, 1982

## 2021-04-26 ENCOUNTER — Ambulatory Visit: Payer: 59 | Attending: Family Medicine | Admitting: Physical Therapy

## 2021-04-26 ENCOUNTER — Other Ambulatory Visit: Payer: Self-pay

## 2021-04-26 DIAGNOSIS — M25611 Stiffness of right shoulder, not elsewhere classified: Secondary | ICD-10-CM | POA: Insufficient documentation

## 2021-04-26 DIAGNOSIS — R252 Cramp and spasm: Secondary | ICD-10-CM | POA: Diagnosis present

## 2021-04-26 DIAGNOSIS — M25511 Pain in right shoulder: Secondary | ICD-10-CM | POA: Insufficient documentation

## 2021-04-26 DIAGNOSIS — G8929 Other chronic pain: Secondary | ICD-10-CM | POA: Insufficient documentation

## 2021-04-26 NOTE — Therapy (Addendum)
[facility] [address] [place], [place], [address] Phone: [phone]   Fax:  [phone]  Physical Therapy Treatment/Discharge Summary   Patient Details  Name: Leroy Johnson MRN: [id number] Date of Birth: 1981/01/23 Referring Provider (PT): Vivi Barrack, MD   Encounter Date: 04/26/2021   PT End of Session - 04/26/21 1656     Visit Number 6    Date for PT Re-Evaluation 06/06/21    Authorization Type aetna    PT Start Time 1620    PT Stop Time 1655    PT Time Calculation (min) 35 min    Activity Tolerance Patient tolerated treatment well             No past medical history on file.  No past surgical history on file.  There were no vitals filed for this visit.   Subjective Assessment - 04/26/21 1623     Subjective I went bowling Saturday so I feel more pain.  Denies pain at rest.  No pain after last visit.    Limitations Lifting;House hold activities;Other (comment)    Currently in Pain? No/denies    Pain Score 0-No pain    Pain Location Shoulder    Pain Orientation Right    Pain Type Chronic pain                [facility] PT Assessment - 04/26/21 0001       AROM   Right Shoulder Flexion 185 Degrees    Right Shoulder ABduction 185 Degrees    Right Shoulder Internal Rotation --   T2                          [facility] Adult PT Treatment/Exercise - 04/26/21 0001       Shoulder Exercises: Prone   Other Prone Exercises over green ball walkouts and weight shifts 3x 1 min each      Shoulder Exercises: Standing   Extension Limitations 3 ways: straight, 45 degrees, 90 degrees 10x each    Row Limitations red band row into external rotation 15x    Other Standing Exercises light green loop wall slides and lift offs 10x    Other Standing Exercises light green loop wall scoops 10x      Shoulder Exercises: ROM/Strengthening   UBE (Upper Arm Bike) 3 min alternating directions     Lat Pull 20 reps    Lat Pull Limitations 30#    Pushups Limitations thigh level table 10x    Modified Plank Limitations shoulder taps 10x leaning low table    Other ROM/Strengthening Exercises kneeling row 5# 20x      Shoulder Exercises: Power Warden/ranger Exercises 30# 20x                      PT Short Term Goals - 04/19/21 1713       PT SHORT TERM GOAL #1   Title Pt will demonstrate improved sitting posture and be able to sit in correct upright posture without cues    Status Achieved      PT SHORT TERM GOAL #2   Title Pt will report 20% less pain    Status Achieved      PT SHORT TERM GOAL #3   Title Patient will be independent with HEP for continued progression at home.    Status Achieved  PT Long Term Goals - 04/26/21 1701       PT LONG TERM GOAL #2   Title Pt will be able to sleep on his right side without pain    Status Achieved                   Plan - 04/26/21 1657     Clinical Impression Statement The patient demonstrates full shoulder ROM and slight hypermoblity with flexion, abduction and internal rotation.  Significantly less symptom irritability so he is able to progress exercise intensity including closed chain/weight bearing ex.  Verbal and tactile cues to activate middle and lower traps as well as lats and limit upper trap compensation.  On track to meet rehab goals in next 3-4 visits.    Rehab Potential Excellent    PT Frequency 1x / week    PT Duration 12 weeks    PT Treatment/Interventions ADLs/Self Care Home Management;Cryotherapy;Electrical Stimulation;Iontophoresis 4mg /ml Dexamethasone;Moist Heat;Therapeutic activities;Therapeutic exercise;Neuromuscular re-education;Manual techniques;Patient/family education;Passive range of motion;Dry needling;Taping    PT Home Exercise Plan lat bar; closed chain/weight bearing strengthening             Patient will benefit from skilled therapeutic  intervention in order to improve the following deficits and impairments:  Pain, Postural dysfunction, Increased muscle spasms, Increased fascial restricitons, Impaired flexibility  Visit Diagnosis: Chronic right shoulder pain  Cramp and spasm  Stiffness of right shoulder, not elsewhere classified   PHYSICAL THERAPY DISCHARGE SUMMARY  Visits from Start of Care: 6  Current functional level related to goals / functional outcomes: The patient is traveling to [place] and requested discharge from PT at this time   Remaining deficits: As above   Education / Equipment: HEP   Patient agrees to discharge. Patient goals were partially met. Patient is being discharged due to the patient's request.   Problem List Patient Active Problem List   Diagnosis Date Noted   Blood glucose abnormal 03/01/2021   Dyslipidemia 03/01/2021   Ruben Im, PT 04/26/21 5:03 PM Phone: [phone] Fax: [phone]  Leroy Johnson 04/26/2021, 5:03 PM   [facility] [address] [place], [place], [address] Phone: [phone]   Fax:  [phone]  Name: Leroy Johnson MRN: [id number] Date of Birth: 1981/05/25

## 2021-05-03 ENCOUNTER — Encounter: Payer: 59 | Admitting: Physical Therapy

## 2021-05-10 ENCOUNTER — Encounter: Payer: 59 | Admitting: Physical Therapy

## 2021-05-17 ENCOUNTER — Encounter: Payer: 59 | Admitting: Physical Therapy

## 2021-05-24 ENCOUNTER — Encounter: Payer: 59 | Admitting: Physical Therapy

## 2022-03-03 ENCOUNTER — Encounter: Payer: 59 | Admitting: Family Medicine

## 2022-04-28 ENCOUNTER — Encounter: Payer: Self-pay | Admitting: Family Medicine

## 2022-04-28 ENCOUNTER — Ambulatory Visit (INDEPENDENT_AMBULATORY_CARE_PROVIDER_SITE_OTHER): Payer: 59 | Admitting: Family Medicine

## 2022-04-28 VITALS — BP 119/80 | HR 87 | Temp 98.1°F | Ht 69.0 in | Wt 175.2 lb

## 2022-04-28 DIAGNOSIS — M25511 Pain in right shoulder: Secondary | ICD-10-CM

## 2022-04-28 DIAGNOSIS — R7309 Other abnormal glucose: Secondary | ICD-10-CM

## 2022-04-28 DIAGNOSIS — E785 Hyperlipidemia, unspecified: Secondary | ICD-10-CM | POA: Diagnosis not present

## 2022-04-28 DIAGNOSIS — G4750 Parasomnia, unspecified: Secondary | ICD-10-CM

## 2022-04-28 DIAGNOSIS — Z0001 Encounter for general adult medical examination with abnormal findings: Secondary | ICD-10-CM

## 2022-04-28 LAB — COMPREHENSIVE METABOLIC PANEL
ALT: 31 U/L (ref 0–53)
AST: 22 U/L (ref 0–37)
Albumin: 4.7 g/dL (ref 3.5–5.2)
Alkaline Phosphatase: 49 U/L (ref 39–117)
BUN: 12 mg/dL (ref 6–23)
CO2: 29 mEq/L (ref 19–32)
Calcium: 9.5 mg/dL (ref 8.4–10.5)
Chloride: 104 mEq/L (ref 96–112)
Creatinine, Ser: 1.06 mg/dL (ref 0.40–1.50)
GFR: 86.18 mL/min (ref >60.00)
Glucose, Bld: 88 mg/dL (ref 70–99)
Potassium: 3.8 mEq/L (ref 3.5–5.1)
Sodium: 139 mEq/L (ref 135–145)
Total Bilirubin: 1.4 mg/dL — ABNORMAL HIGH (ref 0.2–1.2)
Total Protein: 7.5 g/dL (ref 6.0–8.3)

## 2022-04-28 LAB — CBC
HCT: 41.8 % (ref 39.0–52.0)
Hemoglobin: 14.1 g/dL (ref 13.0–17.0)
MCHC: 33.9 g/dL (ref 30.0–36.0)
MCV: 86.2 fl (ref 78.0–100.0)
Platelets: 235 10*3/uL (ref 150.0–400.0)
RBC: 4.84 Mil/uL (ref 4.22–5.81)
RDW: 13.9 % (ref 11.5–15.5)
WBC: 6 10*3/uL (ref 4.0–10.5)

## 2022-04-28 LAB — TSH: TSH: 1.48 u[IU]/mL (ref 0.35–5.50)

## 2022-04-28 LAB — LIPID PANEL
Cholesterol: 219 mg/dL — ABNORMAL HIGH (ref 0–200)
HDL: 35.9 mg/dL — ABNORMAL LOW (ref >39.00)
NonHDL: 182.79
Total CHOL/HDL Ratio: 6
Triglycerides: 314 mg/dL — ABNORMAL HIGH (ref 0.0–149.0)
VLDL: 62.8 mg/dL — ABNORMAL HIGH (ref 0.0–40.0)

## 2022-04-28 LAB — HEMOGLOBIN A1C: Hgb A1c MFr Bld: 5.7 % (ref 4.6–6.5)

## 2022-04-28 LAB — LDL CHOLESTEROL, DIRECT: Direct LDL: 147 mg/dL

## 2022-04-28 NOTE — Progress Notes (Signed)
Chief Complaint:  Leroy Johnson is a 41 y.o. male who presents today for his annual comprehensive physical exam.    Assessment/Plan:  Chronic Problems Addressed Today: Parasomnia We will place referral to sleep medicine for further evaluation and management.  Check labs today.  Right shoulder pain Pain is still persistent.  We saw him about a year ago and referred him to physical therapy.  This was not prickly beneficial.  We will refer him to sports medicine for further management.  Dyslipidemia Check lipids.  Blood glucose abnormal Check A1c.  Preventative Healthcare: Check labs.  Deferred Tdap.  Patient Counseling(The following topics were reviewed and/or handout was given):  -Nutrition: Stressed importance of moderation in sodium/caffeine intake, saturated fat and cholesterol, caloric balance, sufficient intake of fresh fruits, vegetables, and fiber.  -Stressed the importance of regular exercise.   -Substance Abuse: Discussed cessation/primary prevention of tobacco, alcohol, or other drug use; driving or other dangerous activities under the influence; availability of treatment for abuse.   -Injury prevention: Discussed safety belts, safety helmets, smoke detector, smoking near bedding or upholstery.   -Sexuality: Discussed sexually transmitted diseases, partner selection, use of condoms, avoidance of unintended pregnancy and contraceptive alternatives.   -Dental health: Discussed importance of regular tooth brushing, flossing, and dental visits.  -Health maintenance and immunizations reviewed. Please refer to Health maintenance section.  Return to care in 1 year for next preventative visit.     Subjective:  HPI:  He has no acute complaints today. See A/p for status of chronic conditions.   Sometimes will shout and move in his sleep. He is not aware while this is happening.  He injured himself while in [place] due to moving around the bed and striking his head.  He had  to get stitches for this.  This seems to be worsening recently.  Lifestyle Diet: Balanced. Plenty of fruits and vegetables.  Exercise: Has been going to the gym.      04/28/2022    1:48 PM  Depression screen PHQ 2/9  Decreased Interest 0  Down, Depressed, Hopeless 0  PHQ - 2 Score 0    Health Maintenance Due  Topic Date Due   HIV Screening  Never done   Hepatitis C Screening  Never done   TETANUS/TDAP  Never done   COVID-19 Vaccine (4 - Pfizer series) 11/17/2020     ROS: Per HPI, otherwise a complete review of systems was negative.   PMH:  The following were reviewed and entered/updated in epic: History reviewed. No pertinent past medical history. Patient Active Problem List   Diagnosis Date Noted   Right shoulder pain 04/28/2022   Parasomnia 04/28/2022   Blood glucose abnormal 03/01/2021   Dyslipidemia 03/01/2021   History reviewed. No pertinent surgical history.  Family History  Problem Relation Age of Onset   Hypertension Mother    Diabetes Mother    Diabetes Father     Medications- reviewed and updated No current outpatient medications on file.   No current facility-administered medications for this visit.    Allergies-reviewed and updated No Known Allergies  Social History   Socioeconomic History   Marital status: Married    Spouse name: Not on file   Number of children: 1   Years of education: Not on file   Highest education level: Not on file  Occupational History   Occupation: Sport and exercise psychologist  Tobacco Use   Smoking status: Former   Smokeless tobacco: Never  Building services engineer Use: Never  used  Substance and Sexual Activity   Alcohol use: Yes    Comment: Socially/Weekends   Drug use: No   Sexual activity: Yes    Partners: Female  Other Topics Concern   Not on file  Social History Narrative   Not on file   Social Determinants of Health   Financial Resource Strain: Not on file  Food Insecurity: Not on file  Transportation Needs:  Not on file  Physical Activity: Not on file  Stress: Not on file  Social Connections: Not on file        Objective:  Physical Exam: BP 119/80   Pulse 87   Temp 98.1 F (36.7 C) (Temporal)   Ht 5\' 9"  (1.753 m)   Wt 175 lb 3.2 oz (79.5 kg)   SpO2 100%   BMI 25.87 kg/m   Body mass index is 25.87 kg/m. Wt Readings from Last 3 Encounters:  04/28/22 175 lb 3.2 oz (79.5 kg)  03/01/21 179 lb (81.2 kg)  02/06/20 169 lb (76.7 kg)   Gen: NAD, resting comfortably HEENT: TMs normal bilaterally. OP clear. No thyromegaly noted.  CV: RRR with no murmurs appreciated Pulm: NWOB, CTAB with no crackles, wheezes, or rhonchi GI: Normal bowel sounds present. Soft, Nontender, Nondistended. MSK: no edema, cyanosis, or clubbing noted Skin: warm, dry Neuro: CN2-12 grossly intact. Strength 5/5 in upper and lower extremities. Reflexes symmetric and intact bilaterally.  Psych: Normal affect and thought content     Terald Jump M. 02/08/20, MD 04/28/2022 2:09 PM

## 2022-04-28 NOTE — Assessment & Plan Note (Signed)
We will place referral to sleep medicine for further evaluation and management.  Check labs today.

## 2022-04-28 NOTE — Addendum Note (Signed)
Addended by: Lorn Junes on: 04/28/2022 04:28 PM   Modules accepted: Orders

## 2022-04-28 NOTE — Assessment & Plan Note (Signed)
Check A1c. 

## 2022-04-28 NOTE — Assessment & Plan Note (Signed)
Pain is still persistent.  We saw him about a year ago and referred him to physical therapy.  This was not prickly beneficial.  We will refer him to sports medicine for further management.

## 2022-04-28 NOTE — Patient Instructions (Addendum)
It was very nice to see you today!  We will check blood work today.  I will place an order for you to get a CT scan of your heart.  I will refer you to sports medicine for your shoulder.  I will refer you for a sleep study.   Please come back to see me in a year for your next physical or sooner if needed.   Take care, Dr Jimmey Ralph  PLEASE NOTE:  If you had any lab tests please let us know if you have not heard back within a few days. You may see your results on mychart before we have a chance to review them but we will give you a call once they are reviewed by [place]. If we ordered any referrals today, please let us know if you have not heard from their office within the next week.   Please try these tips to maintain a healthy lifestyle:  Eat at least 3 REAL meals and 1-2 snacks per day.  Aim for no more than 5 hours between eating.  If you eat breakfast, please do so within one hour of getting up.   Each meal should contain half fruits/vegetables, one quarter protein, and one quarter carbs (no bigger than a computer mouse)  Cut down on sweet beverages. This includes juice, soda, and sweet tea.   Drink at least 1 glass of water with each meal and aim for at least 8 glasses per day  Exercise at least 150 minutes every week.    Preventive Care 43-56 Years Old, Male Preventive care refers to lifestyle choices and visits with your health care provider that can promote health and wellness. Preventive care visits are also called wellness exams. What can I expect for my preventive care visit? Counseling During your preventive care visit, your health care provider may ask about your: Medical history, including: Past medical problems. Family medical history. Current health, including: Emotional well-being. Home life and relationship well-being. Sexual activity. Lifestyle, including: Alcohol, nicotine or tobacco, and drug use. Access to firearms. Diet, exercise, and sleep habits. Safety  issues such as seatbelt and bike helmet use. Sunscreen use. Work and work Astronomer. Physical exam Your health care provider will check your: Height and weight. These may be used to calculate your BMI (body mass index). BMI is a measurement that tells if you are at a healthy weight. Waist circumference. This measures the distance around your waistline. This measurement also tells if you are at a healthy weight and may help predict your risk of certain diseases, such as type 2 diabetes and high blood pressure. Heart rate and blood pressure. Body temperature. Skin for abnormal spots. What immunizations do I need?  Vaccines are usually given at various ages, according to a schedule. Your health care provider will recommend vaccines for you based on your age, medical history, and lifestyle or other factors, such as travel or where you work. What tests do I need? Screening Your health care provider may recommend screening tests for certain conditions. This may include: Lipid and cholesterol levels. Diabetes screening. This is done by checking your blood sugar (glucose) after you have not eaten for a while (fasting). Hepatitis B test. Hepatitis C test. HIV (human immunodeficiency virus) test. STI (sexually transmitted infection) testing, if you are at risk. Lung cancer screening. Prostate cancer screening. Colorectal cancer screening. Talk with your health care provider about your test results, treatment options, and if necessary, the need for more tests. Follow these instructions at  home: Eating and drinking  Eat a diet that includes fresh fruits and vegetables, whole grains, lean protein, and low-fat dairy products. Take vitamin and mineral supplements as recommended by your health care provider. Do not drink alcohol if your health care provider tells you not to drink. If you drink alcohol: Limit how much you have to 0-2 drinks a day. Know how much alcohol is in your drink. In the  [place] one drink equals one 12 oz bottle of beer (355 mL), one 5 oz glass of wine (148 mL), or one 1 oz glass of hard liquor (44 mL). Lifestyle Brush your teeth every morning and night with fluoride toothpaste. Floss one time each day. Exercise for at least 30 minutes 5 or more days each week. Do not use any products that contain nicotine or tobacco. These products include cigarettes, chewing tobacco, and vaping devices, such as e-cigarettes. If you need help quitting, ask your health care provider. Do not use drugs. If you are sexually active, practice safe sex. Use a condom or other form of protection to prevent STIs. Take aspirin only as told by your health care provider. Make sure that you understand how much to take and what form to take. Work with your health care provider to find out whether it is safe and beneficial for you to take aspirin daily. Find healthy ways to manage stress, such as: Meditation, yoga, or listening to music. Journaling. Talking to a trusted person. Spending time with friends and family. Minimize exposure to UV radiation to reduce your risk of skin cancer. Safety Always wear your seat belt while driving or riding in a vehicle. Do not drive: If you have been drinking alcohol. Do not ride with someone who has been drinking. When you are tired or distracted. While texting. If you have been using any mind-altering substances or drugs. Wear a helmet and other protective equipment during sports activities. If you have firearms in your house, make sure you follow all gun safety procedures. What's next? Go to your health care provider once a year for an annual wellness visit. Ask your health care provider how often you should have your eyes and teeth checked. Stay up to date on all vaccines. This information is not intended to replace advice given to you by your health care provider. Make sure you discuss any questions you have with your health care  provider. Document Revised: 03/09/2021 Document Reviewed: 03/09/2021 Elsevier Patient Education  2023 [employer].

## 2022-04-28 NOTE — Assessment & Plan Note (Signed)
Check lipids 

## 2022-04-29 LAB — URINALYSIS, ROUTINE W REFLEX MICROSCOPIC
Bilirubin Urine: NEGATIVE
Glucose, UA: NEGATIVE
Hgb urine dipstick: NEGATIVE
Ketones, ur: NEGATIVE
Leukocytes,Ua: NEGATIVE
Nitrite: NEGATIVE
Protein, ur: NEGATIVE
Specific Gravity, Urine: 1.008 (ref 1.001–1.035)
pH: 7.5 (ref 5.0–8.0)

## 2022-05-01 NOTE — Progress Notes (Signed)
Please inform patient of the following:  Cholesterol and blood sugar are both elevated.  Do not need to make any changes to his treatment plan at this time.  He should continue to work on diet and exercise and we can recheck in about a year.

## 2022-05-03 ENCOUNTER — Ambulatory Visit (INDEPENDENT_AMBULATORY_CARE_PROVIDER_SITE_OTHER): Payer: 59

## 2022-05-03 ENCOUNTER — Ambulatory Visit: Payer: 59 | Admitting: Family Medicine

## 2022-05-03 ENCOUNTER — Ambulatory Visit: Payer: Self-pay

## 2022-05-03 VITALS — BP 124/84 | HR 83 | Ht 69.0 in | Wt 176.4 lb

## 2022-05-03 DIAGNOSIS — M25511 Pain in right shoulder: Secondary | ICD-10-CM

## 2022-05-03 DIAGNOSIS — M5010 Cervical disc disorder with radiculopathy, unspecified cervical region: Secondary | ICD-10-CM | POA: Diagnosis not present

## 2022-05-03 DIAGNOSIS — G8929 Other chronic pain: Secondary | ICD-10-CM | POA: Diagnosis not present

## 2022-05-03 MED ORDER — GABAPENTIN 300 MG PO CAPS: 300.0000 mg | ORAL_CAPSULE | Freq: Three times a day (TID) | ORAL | 3 refills | Status: DC | PRN

## 2022-05-03 MED ORDER — PREDNISONE 50 MG PO TABS: 50.0000 mg | ORAL_TABLET | Freq: Every day | ORAL | 0 refills | Status: DC

## 2022-05-03 NOTE — Patient Instructions (Addendum)
Thank you for coming in today.   I've referred you to Physical Therapy.  Let us know if you don't hear from them in one week.   Take gabapentin and prednisone.   Recheck in 6 weeks.

## 2022-05-03 NOTE — Progress Notes (Signed)
I, Philbert Riser, LAT, ATC acting as a scribe for Clementeen Graham, MD.  Subjective:    CC: R shoulder pain  HPI: Pt is a 41 y/ male c/o R shoulder pain ongoing 2 year. Pt is R-hand dominate. Pt does not recall a mechanism of cause of the pain. Pt locates pain to all over R GH joint. Pt works as a Sport and exercise psychologist.  Pain is worse with a seated position with his arm down at his side.  Pain is not worse with overhead motion or reaching back.  Pain is located to the chest and lateral upper arm.  Neck pain: no Radiates: no UE Numbness/tingling: no UE Weakness: no Aggravates: sitting w/ weight resting on bent R arm Treatments tried: prior course of PT in 2022- not helpful  Pertinent review of Systems: No fevers or chills  Relevant historical information: Otherwise healthy   Objective:    Vitals:   05/03/22 1504  BP: 124/84  Pulse: 83  SpO2: 97%   General: Well Developed, well nourished, and in no acute distress.   MSK: C-spine: Normal. Nontender midline. Normal cervical motion. Negative Spurling's test. Upper extremity strength is intact. Reflexes are intact.  Right shoulder: Normal-appearing Nontender. Normal shoulder motion. Intact strength. Negative Hawkins and Neer's test. Negative Yergason's and speeds test.   Lab and Radiology Results  MRI right shoulder and cervical spine obtained and [place] on February 28, 2022 significant for mild supraspinatus tendinopathy, mild subscapularis tendinopathy, mild AC joint degeneration, and most significantly for right cervical nerve root compression at C4-5. This MRI report will be sent to scan.   Impression and Recommendations:    Assessment and Plan: 41 y.o. male with right shoulder and upper arm pain.  Based on his physical exam findings and his relatively recent MRI report most likely explanation is cervical radiculopathy at C5.  This matches his cervical spine MRI. Plan for short course of prednisone and gabapentin.   Additionally we will proceed to physical therapy.  Check back in 6 weeks.  If not improved consider cervical epidural steroid injection.Marland Kitchen  PDMP not reviewed this encounter. Orders Placed This Encounter  Procedures   DG Shoulder Right    Standing Status:   Future    Number of Occurrences:   1    Standing Expiration Date:   05/04/2023    Order Specific Question:   Reason for Exam (SYMPTOM  OR DIAGNOSIS REQUIRED)    Answer:   right shoulder pain    Order Specific Question:   Preferred imaging location?    Answer:   [facility]   Ambulatory referral to Physical Therapy    Referral Priority:   Routine    Referral Type:   Physical Medicine    Referral Reason:   Specialty Services Required    Requested Specialty:   Physical Therapy    Number of Visits Requested:   1   Meds ordered this encounter  Medications   predniSONE (DELTASONE) 50 MG tablet    Sig: Take 1 tablet (50 mg total) by mouth daily.    Dispense:  5 tablet    Refill:  0   gabapentin (NEURONTIN) 300 MG capsule    Sig: Take 1 capsule (300 mg total) by mouth 3 (three) times daily as needed.    Dispense:  90 capsule    Refill:  3    Discussed warning signs or symptoms. Please see discharge instructions. Patient expresses understanding.   The above documentation has been reviewed and  is accurate and complete Clementeen Graham, M.D.

## 2022-05-04 NOTE — Progress Notes (Signed)
Right shoulder x-ray shows some minimal arthritis changes.

## 2022-05-30 ENCOUNTER — Ambulatory Visit (INDEPENDENT_AMBULATORY_CARE_PROVIDER_SITE_OTHER): Payer: 59 | Admitting: Neurology

## 2022-05-30 ENCOUNTER — Encounter: Payer: Self-pay | Admitting: Neurology

## 2022-05-30 VITALS — BP 103/68 | HR 83 | Ht 69.0 in | Wt 176.0 lb

## 2022-05-30 DIAGNOSIS — R0683 Snoring: Secondary | ICD-10-CM | POA: Insufficient documentation

## 2022-05-30 DIAGNOSIS — G4719 Other hypersomnia: Secondary | ICD-10-CM

## 2022-05-30 DIAGNOSIS — G4752 REM sleep behavior disorder: Secondary | ICD-10-CM | POA: Insufficient documentation

## 2022-05-30 DIAGNOSIS — Z72821 Inadequate sleep hygiene: Secondary | ICD-10-CM | POA: Insufficient documentation

## 2022-05-30 DIAGNOSIS — G4721 Circadian rhythm sleep disorder, delayed sleep phase type: Secondary | ICD-10-CM | POA: Insufficient documentation

## 2022-05-30 MED ORDER — MELATONIN 3 MG PO CAPS: ORAL_CAPSULE | ORAL | 0 refills | Status: DC

## 2022-05-30 NOTE — Progress Notes (Addendum)
SLEEP MEDICINE CLINIC    Provider:  Melvyn Novas, MD  Primary Care Physician:  Ardith Dark, MD [address] [place] [address]     Referring Provider: Ardith Dark, Md [address] [place],  [place] [address]          Chief Complaint according to patient   Patient presents with:     New Patient (Initial Visit)     Overall avg 6-8 hrs of sleep. He snores in sleep. He has reportedly punched the wall, Has been told he starts fighting in sleep and yells out.  He worked recently in [place] for 4 weeks online, was on a different sleep wake cycle. He may have fallen out of bed there, had a bloody nose but didn't see where in the room he may have gotten injured. Vivid dreams , he has to defend himself or escape a situation- enacting dreams.   Not a sleep walker.        HISTORY OF PRESENT ILLNESS:  Leroy Johnson is a 41 y.o. year old Asian- [place] male patient seen here as a referral from PCP  on 05/30/2022 for a .parasomnia, sleep disorder.   Chief concern according to patient :  see above    I have the pleasure of seeing Leroy Johnson today, a right -handed Asian male with a possible sleep disorder. He  has no past medical history on file. No surgeries, no medications. Wore braces. Dream enactment has been happening for many years , as long as he is married.      Family medical /sleep history: no other family member with OSA, insomnia, sleep walkers. mother had PD.    Social history:  Patient is working as an Psychiatric nurse  and lives in a household with spouse,  with one 66 year -old son. No pets.  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  ETOH use - I on Friday and Saturday- ,  Caffeine intake in form of Coffee( /) Soda( /) Tea ( green tea 2 cups) or energy drinks. Regular exercise in form of gym .    Sleep habits are as follows: The patient's dinner time is between 9-10 PM. The patient goes to bed at midnight  - 1 AM and continues to sleep for 5-7 hours, wakes for 1 bathroom break.  He does use his cell phone when he is in bed.  The preferred sleep position is supine or laterally , with the support of 1 pillow. Dreams are reportedly frequent/vivid.     6. 15 AM is the usual rise time. The patient wakes up spontaneously at 6 AM .  He reports not feeling refreshed or restored in AM, with symptoms such as dry mouth,  and residual fatigue.  Naps are taken infrequently, lasting from 30 to 90 minutes and are  refreshing.    Review of Systems: Out of a complete 14 system review, the patient complains of only the following symptoms, and all other reviewed systems are negative.:  Fatigue, sleepiness , snoring,  Enactment of dreams. fragmented sleep, delayed sleep onset-  and he believes dream enactment starts right after sleep onset.    How likely are you to doze in the following situations: 0 = not likely, 1 = slight chance, 2 = moderate chance, 3 = high chance   Sitting and Reading? Watching Television? Sitting inactive in a public place (theater or meeting)? As a passenger in  a car for an hour without a break? Lying down in the afternoon when circumstances permit? Sitting and talking to someone? Sitting quietly after lunch without alcohol? In a car, while stopped for a few minutes in traffic?   Total = 3/ 24 points   FSS endorsed at na/ 63 points.   Social History   Socioeconomic History   Marital status: Married    Spouse name: Not on file   Number of children: 1   Years of education: Not on file   Highest education level: Not on file  Occupational History   Occupation: Sport and exercise psychologist  Tobacco Use   Smoking status: Former   Smokeless tobacco: Never  Building services engineer Use: Never used  Substance and Sexual Activity   Alcohol use: Yes    Comment: Socially/Weekends   Drug use: No   Sexual activity: Yes    Partners: Female  Other Topics Concern   Not on file  Social History  Narrative   Not on file   Social Determinants of Health   Financial Resource Strain: Not on file  Food Insecurity: Not on file  Transportation Needs: Not on file  Physical Activity: Not on file  Stress: Not on file  Social Connections: Not on file    Family History  Problem Relation Age of Onset   Hypertension Mother    Diabetes Mother    Diabetes Father     Current Outpatient Medications on File Prior to Visit  Medication Sig Dispense Refill   gabapentin (NEURONTIN) 300 MG capsule Take 1 capsule (300 mg total) by mouth 3 (three) times daily as needed. 90 capsule 3   predniSONE (DELTASONE) 50 MG tablet Take 1 tablet (50 mg total) by mouth daily. 5 tablet 0   No current facility-administered medications on file prior to visit.    No Known Allergies  Physical exam:  Today's Vitals   05/30/22 1515  BP: 103/68  Pulse: 83  Weight: 176 lb (79.8 kg)  Height: 5\' 9"  (1.753 m)   Body mass index is 25.99 kg/m.   Wt Readings from Last 3 Encounters:  05/30/22 176 lb (79.8 kg)  05/03/22 176 lb 6.4 oz (80 kg)  04/28/22 175 lb 3.2 oz (79.5 kg)     Ht Readings from Last 3 Encounters:  05/30/22 5\' 9"  (1.753 m)  05/03/22 5\' 9"  (1.753 m)  04/28/22 5\' 9"  (1.753 m)      General: The patient is awake, alert and appears not in acute distress. The patient is well groomed. Head: Normocephalic, atraumatic. Neck is supple.  Mallampati 1-2,  neck circumference:16 inches. Nasal airflow  patent.  Retrognathia is not seen.  Dental status: biological.  Cardiovascular:  Regular rate and cardiac rhythm by pulse,  without distended neck veins. Respiratory: Lungs are clear to auscultation.  Skin:  Without evidence of ankle edema, or rash. Trunk: The patient's posture is erect.   Neurologic exam : The patient is awake and alert, oriented to place and time.   Memory subjective described as intact.  Attention span & concentration ability appears normal.  Speech is fluent,  without   dysarthria, dysphonia or aphasia.  Mood and affect are appropriate.   Cranial nerves: no loss of smell or taste reported  Pupils are equal and briskly reactive to light. Funduscopic exam deferred. The right lens seems cloudy- cataract ?  07/03/22  Extraocular movements in vertical and horizontal planes were intact and without nystagmus. No Diplopia. Visual fields by finger perimetry  are intact. Hearing was intact to soft voice and finger rubbing.  Facial sensation intact to fine touch.  Facial motor strength is symmetric and tongue and uvula move midline.  Neck ROM : rotation, tilt and flexion extension were normal for age and shoulder shrug was symmetrical.    Motor exam:  Symmetric bulk, tone and ROM.   Normal tone without cog -wheeling, symmetric grip strength- strong grip.  .   Sensory:  Fine touch and vibration were  normal.  Proprioception tested in the upper extremities was normal.   Coordination: Rapid alternating movements in the fingers/hands were of normal speed.  The Finger-to-nose maneuver was intact without evidence of ataxia, dysmetria or tremor.   Gait and station: Patient could rise unassisted from a seated position, walked without assistive device.  Stance is of normal width/ base and the patient turned with 3 steps.  Toe and heel walk were deferred.  Deep tendon reflexes: in the  upper and lower extremities are symmetric and intact.  Babinski response was deferred.         After spending a total time of  35  minutes face to face and additional time for physical and neurologic examination, review of laboratory studies,  personal review of imaging studies, reports and results of other testing and review of referral information / records as far as provided in visit, I have established the following assessments:  1) Dream enactment - but this is not associated with sleep walking, he is usually amnestic for the spell. He has been acting out dreams for the last decade.  2)  mother had PD. Onset about age 34, she had REM BD.  3) has fallen out of bed, feels usually threatened by what he dreams off.  4) needs over 7 hours of sleep to feel well rested.  5) snoring , worse on weekends.    My Plan is to proceed with:  1) sleep hygiene, eliminate the cell phone from the bedroom- no TV.  2) go to bed before midnight- and allow or 8 years. Allow a walk in PM after dinner.  3) hot shower before bed time. Cool, quiet and dark.  4) attended sleep study with REM BD montage  5) meditate to resolve stress before bedtime.  I reccommended to start on 3 mg melatonin at bedtime.   I would like to thank Ardith Dark, MD and Ardith Dark, Md [address] [place],  [place] [address] for allowing me to meet with and to take care of this pleasant patient.   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   CC: I will share my notes with PCP .  Electronically signed by: Melvyn Novas, MD 05/30/2022 3:22 PM  [facility] Board certified by The [employer] of Sleep Medicine and Diplomate of the [employer] of Sleep Medicine. Board certified In Neurology through the ABPN, Fellow of the [employer] of Neurology. Medical Director of [facility].

## 2022-05-30 NOTE — Addendum Note (Signed)
Addended by: Melvyn Novas on: 05/30/2022 04:00 PM   Modules accepted: Orders

## 2022-05-30 NOTE — Patient Instructions (Addendum)
Sleep Study, Adult A sleep study (polysomnogram) is a series of tests done while you are sleeping. A sleep study records your brain waves, heart rate, breathing rate, oxygen level, and eye and leg movements. A sleep study helps your health care provider: See how well you sleep. Diagnose a sleep disorder. Determine how severe your sleep disorder is. Create a plan to treat your sleep disorder. Your health care provider may recommend a sleep study if you: Feel sleepy on most days. Snore loudly while sleeping. Have unusual behaviors while you sleep, such as walking. Have brief periods in which you stop breathing during sleep (sleepapnea). Fall asleep suddenly during the day (narcolepsy). Have trouble falling asleep or staying asleep (insomnia). Feel like you need to move your legs when trying to fall asleep (restless legs syndrome). Move your legs by flexing and extending them regularly while asleep (periodic limb movement disorder). Act out your dreams while you sleep (sleep behavior disorder). Feel like you cannot move when you first wake up (sleep paralysis). What tests are part of a sleep study? Most sleep studies record the following during sleep: Brain activity. Eye movements. Heart rate and rhythm. Breathing rate and rhythm. Blood-oxygen level. Blood pressure. Chest and belly movement as you breathe. Arm and leg movements. Snoring or other noises. Body position. Where are sleep studies done? Sleep studies are done at sleep centers. A sleep center may be inside a hospital, office, or clinic. The room where you have the study may look like a hospital room or a hotel room. The health care providers doing the study may come in and out of the room during the study. Most of the time, they will be in another room monitoring your test as you sleep. How are sleep studies done? Most sleep studies are done during a normal period of time for a full night of sleep. You will arrive at the  study center in the evening and go home in the morning. Before the test Bring your pajamas and toothbrush with you to the sleep study. Do not have caffeine on the day of your sleep study. Do not drink alcohol on the day of your sleep study. Your health care provider will let you know if you should stop taking any of your regular medicines before the test. During the test     Round, sticky patches with sensors attached to recording wires (electrodes) are placed on your scalp, face, chest, and limbs. Wires from all the electrodes and sensors run from your bed to a computer. The wires can be taken off and put back on if you need to get out of bed to go to the bathroom. A sensor is placed over your nose to measure airflow. A finger clip is put on your finger or ear to measure your blood oxygen level (pulse oximetry). A belt is placed around your belly and a belt is placed around your chest to measure breathing movements. If you have signs of the sleep disorder called sleep apnea during your test, you may get a treatment mask to wear for the second half of the night. The mask provides positive airway pressure (PAP) to help you breathe better during sleep. This may greatly improve your sleep apnea. You will then have all tests done again with the mask in place to see if your measurements and recordings change. After the test A medical doctor who specializes in sleep will evaluate the results of your sleep study and share them with you and your  primary health care provider. Based on your results, your medical history, and a physical exam, you may be diagnosed with a sleep disorder, such as: Sleep apnea. Restless legs syndrome. Sleep-related behavior disorder. Sleep-related movement disorders. Sleep-related seizure disorders. Your health care team will help determine your treatment options based on your diagnosis. This may include: Improving your sleep habits (sleep hygiene). Wearing a continuous  positive airway pressure (CPAP) or bi-level positive airway pressure (BIPAP) mask. Wearing an oral device at night to improve breathing and reduce snoring. Taking medicines. Follow these instructions at home: Take over-the-counter and prescription medicines only as told by your health care provider. If you are instructed to use a CPAP or BIPAP mask, make sure you use it nightly as directed. Make any lifestyle changes that your health care provider recommends. If you were given a device to open your airway while you sleep, use it only as told by your health care provider. Do not use any tobacco products, such as cigarettes, chewing tobacco, and e-cigarettes. If you need help quitting, ask your health care provider. Keep all follow-up visits as told by your health care provider. This is important. Summary A sleep study (polysomnogram) is a series of tests done while you are sleeping. It shows how well you sleep. Most sleep studies are done over one full night of sleep. You will arrive at the study center in the evening and go home in the morning. If you have signs of the sleep disorder called sleep apnea during your test, you may get a treatment mask to wear for the second half of the night. A medical doctor who specializes in sleep will evaluate the results of your sleep study and share them with you and your primary health care provider. This information is not intended to replace advice given to you by your health care provider. Make sure you discuss any questions you have with your health care provider. Document Revised: 07/26/2021 Document Reviewed: 07/11/2021 Elsevier Patient Education  2023 [employer].

## 2022-06-02 NOTE — Therapy (Unsigned)
OUTPATIENT PHYSICAL THERAPY CERVICAL EVALUATION   Patient Name: Leroy Johnson MRN: [id number] DOB:07/08/1981, 41 y.o., male Today's Date: 06/02/2022    No past medical history on file. No past surgical history on file. 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  Rationale for Evaluation and Treatment Rehabilitation  ONSET DATE: ***  SUBJECTIVE:                                                                                                                                                                                                         SUBJECTIVE STATEMENT:  Pt is a 44 y/ male c/o R shoulder pain ongoing 2 year. Pt is R-hand dominate. Pt does not recall a mechanism of cause of the pain. Pt locates pain to all over R GH joint. Pt works as a Sport and exercise psychologist. Pain is worse with a seated position with his arm down at his side.  Pain is not worse with overhead motion or reaching back.  Pain is located to the chest and lateral upper arm.  PERTINENT HISTORY:  None  PAIN:  Are you having pain? Yes: NPRS scale: ***/10 Pain location: *** Pain description: *** Aggravating factors: *** Relieving factors: ***  PRECAUTIONS: {Therapy precautions:24002}  WEIGHT BEARING RESTRICTIONS {Yes ***/No:24003}  FALLS:  Has patient fallen in last 6 months? {fallsyesno:27318}  LIVING ENVIRONMENT: Lives with: {OPRC lives with:25569::"lives with their  family"} Lives in: {Lives in:25570} Stairs: {opstairs:27293} Has following equipment at home: {Assistive devices:23999}  OCCUPATION: ***  PLOF: {PLOF:24004}  PATIENT GOALS ***  OBJECTIVE:   DIAGNOSTIC FINDINGS:  Minimal glenohumeral osteoarthritis.   COGNITION: Overall cognitive status: Within functional limits for tasks assessed   SENSATION: {sensation:27233}  POSTURE: {posture:25561}  PALPATION: ***   CERVICAL ROM:   {AROM/PROM:27142} ROM A/PROM (deg) eval  Flexion   Extension   Right lateral flexion   Left lateral flexion   Right rotation   Left rotation    (Blank rows = not tested)  UPPER EXTREMITY ROM:  {AROM/PROM:27142} ROM Right eval Left eval  Shoulder flexion  Shoulder extension    Shoulder abduction    Shoulder adduction    Shoulder extension    Shoulder internal rotation    Shoulder external rotation    Elbow flexion    Elbow extension    Wrist flexion    Wrist extension    Wrist ulnar deviation    Wrist radial deviation    Wrist pronation    Wrist supination     (Blank rows = not tested)  UPPER EXTREMITY MMT:  MMT Right eval Left eval  Shoulder flexion    Shoulder extension    Shoulder abduction    Shoulder adduction    Shoulder extension    Shoulder internal rotation    Shoulder external rotation    Middle trapezius    Lower trapezius    Elbow flexion    Elbow extension    Wrist flexion    Wrist extension    Wrist ulnar deviation    Wrist radial deviation    Wrist pronation    Wrist supination    Grip strength     (Blank rows = not tested)  CERVICAL SPECIAL TESTS:  {Cervical special tests:25246}   FUNCTIONAL TESTS:  {Functional tests:24029}   TODAY'S TREATMENT:  Creating, reviewing, and completing below HEP   PATIENT EDUCATION:  Education details: Educated pt on anatomy and physiology of current symptoms, diagnosis, prognosis, HEP,  and POC. Person educated: Patient Education method:  Explanation Education comprehension: verbalized understanding and returned demonstration   HOME EXERCISE PROGRAM:  *** ASSESSMENT:  CLINICAL IMPRESSION: Patient referred to PT for  .Patient will benefit from skilled PT to address below impairments, limitations and improve overall function.  OBJECTIVE IMPAIRMENTS: decreased activity tolerance, decreased shoulder mobility, decreased ROM, decreased strength, impaired flexibility, impaired UE use, postural dysfunction, and pain.  ACTIVITY LIMITATIONS: reaching, lifting, carry,  cleaning, driving, and or occupation  PERSONAL FACTORS:  also affecting patient's functional outcome.  REHAB POTENTIAL: Good  CLINICAL DECISION MAKING: Stable/uncomplicated  EVALUATION COMPLEXITY: Low    GOALS: Short term PT Goals Target date: {follow up:25551} Pt will be I and compliant with HEP. Baseline:  Goal status: New Pt will decrease pain by 25% overall Baseline: Goal status: New  Long term PT goals Target date: {follow up:25551} Pt will improve Rt shoulder AROM to [facility] to improve functional reaching Baseline: Goal status: New Pt will improve  Rt shoulder strength to at least 4+/5 MMT to improve functional strength Baseline: Goal status: New Pt will improve FOTO to at least % functional to show improved function Baseline: Goal status: New Pt will reduce pain to overall less than 3/10 with usual activity and work activity. Baseline: Goal status: New  PLAN: PT FREQUENCY: 1***  PT DURATION: ***  PLANNED INTERVENTIONS (unless contraindicated): aquatic PT, Canalith repositioning, cryotherapy, Electrical stimulation, Iontophoresis with 4 mg/ml dexamethasome, Moist heat, traction, Ultrasound, gait training, Therapeutic exercise, balance training, neuromuscular re-education, patient/family education, prosthetic training, manual techniques, passive ROM, dry needling, taping, vasopnuematic device, vestibular, spinal manipulations, joint  manipulations  PLAN FOR NEXT SESSION: Assess HEP/update PRN, continue to progress **** mobility, strengthen **** muscles. Decrease patients pain and help minimize ****    Champ Mungo, PT 06/02/2022, 12:33 PM

## 2022-06-05 ENCOUNTER — Ambulatory Visit: Payer: 59 | Admitting: Physical Therapy

## 2022-06-05 ENCOUNTER — Other Ambulatory Visit: Payer: Self-pay

## 2022-06-05 DIAGNOSIS — M25511 Pain in right shoulder: Secondary | ICD-10-CM

## 2022-06-05 DIAGNOSIS — R293 Abnormal posture: Secondary | ICD-10-CM

## 2022-06-05 DIAGNOSIS — M6281 Muscle weakness (generalized): Secondary | ICD-10-CM | POA: Diagnosis not present

## 2022-06-05 DIAGNOSIS — G8929 Other chronic pain: Secondary | ICD-10-CM | POA: Diagnosis not present

## 2022-06-09 ENCOUNTER — Ambulatory Visit: Payer: 59 | Admitting: Family Medicine

## 2022-06-12 ENCOUNTER — Encounter: Payer: 59 | Admitting: Physical Therapy

## 2022-06-12 NOTE — Therapy (Deleted)
OUTPATIENT PHYSICAL THERAPY TREATMENT NOTE   Patient Name: Leroy Johnson MRN: [id number] DOB:Feb 08, 1981, 41 y.o., male Today's Date: 06/12/2022     END OF SESSION:    No past medical history on file. No past surgical history on file. Patient Active Problem List   Diagnosis Date Noted   Dream enactment behavior 05/30/2022   Inadequate sleep hygiene 05/30/2022   Episodic sleep-wake schedule disorder, delayed phase type 05/30/2022   Snoring 05/30/2022   Excessive daytime sleepiness 05/30/2022   Right shoulder pain 04/28/2022   Parasomnia 04/28/2022   Blood glucose abnormal 03/01/2021   Dyslipidemia 03/01/2021   PCP: Vivi Barrack, MD   REFERRING PROVIDER: Gregor Hams, MD   REFERRING DIAG: Cervical disc disorder with radiculopathy of cervical region [M50.10]   THERAPY DIAG:  Chronic right shoulder pain - Plan: PT plan of care cert/re-cert   Muscle weakness (generalized) - Plan: PT plan of care cert/re-cert   Abnormal posture - Plan: PT plan of care cert/re-cert   Rationale for Evaluation and Treatment Rehabilitation   ONSET DATE: 2 years   SUBJECTIVE:                                                                                                                                                                                                          SUBJECTIVE STATEMENT: 06/12/2022  Eval: Pt presents to PT with c/o R shoulder pain ongoing 2 year. Pt is R-hand dominate. Pt does not recall a mechanism of cause of the pain. Pt locates pain to all over R GH joint. Pt works as a Financial planner. Pain is worse with a seated position with his arm down at his side or when resting his arm on an arm rest. Pt reports numbness/ tingling that radiates down to his elbow.    PERTINENT HISTORY:  None   PAIN:  Are you having pain? Yes: NPRS scale: 6-7/10 Pain location: R shoulder into pec and labrum.  Pain description: Sharp pain Aggravating factors: Resting arm by his  side.  Relieving factors: Nothing.    PRECAUTIONS: None   WEIGHT BEARING RESTRICTIONS No   FALLS:  Has patient fallen in last 6 months? No   LIVING ENVIRONMENT: Lives with: lives with their family Lives in: House/apartment Stairs: No Has following equipment at home: None   OCCUPATION: Financial planner   PLOF: Independent   PATIENT GOALS Pt would like to reduce the pain in his shoulder.    OBJECTIVE:    DIAGNOSTIC FINDINGS:  Minimal glenohumeral osteoarthritis.     COGNITION: Overall cognitive status:  Within functional limits for tasks assessed     SENSATION: WFL   POSTURE: rounded shoulders and forward head   PALPATION: No tenderness to palpation surrounding R GHJ.              CERVICAL ROM:    Active ROM A/PROM (deg) eval  Flexion WFL  Extension WFL  Right lateral flexion WFL  Left lateral flexion WFL  Right rotation WFL  Left rotation WFL   (Blank rows = not tested)   UPPER EXTREMITY ROM:   Active ROM Right eval Left eval  Shoulder flexion [facility]  Shoulder extension [facility]  Shoulder abduction [facility]  Shoulder adduction      Shoulder internal rotation [facility] WFL  Shoulder external rotation WFL WFL   (Blank rows = not tested)   UPPER EXTREMITY MMT:   MMT Right eval Left eval  Shoulder flexion 3+ 3+  Shoulder extension      Shoulder abduction 4 4  Shoulder adduction      Shoulder extension      Shoulder internal rotation 4- 4-  Shoulder external rotation 4- 4-  Middle trapezius      Lower trapezius       (Blank rows = not tested)   CERVICAL SPECIAL TESTS:  Upper limb tension test (ULTT): Negative     TODAY'S TREATMENT:  06/12/2022  Therapeutic Exercise:  Aerobic: Supine: Prone:  Seated:  Standing: Neuromuscular Re-education: Manual Therapy: Therapeutic Activity: Self Care: Trigger Point Dry Needling:  Modalities:    Creating, reviewing, and completing below HEP     PATIENT EDUCATION:  Education details: Educated  pt on anatomy and physiology of current symptoms, diagnosis, prognosis, HEP,  and POC. Person educated: Patient Education method: Explanation Education comprehension: verbalized understanding and returned demonstration     HOME EXERCISE PROGRAM:   Access Code: [email or web address] URL: [email or web address] Date: 06/05/2022 Prepared by: Royal Hawthorn   Exercises - Seated Scapular Retraction  - 2 x daily - 7 x weekly - 2 sets - 10 reps - 2 hold - Standing Shoulder Row with Anchored Resistance  - 2 x daily - 7 x weekly - 2 sets - 10 reps - Shoulder External Rotation and Scapular Retraction with Resistance  - 2 x daily - 7 x weekly - 2 sets - 10 reps   ASSESSMENT:   CLINICAL IMPRESSION:  06/12/2022  Eval: Patient referred to PT for R shoulder pain with N/T into elbow. Pt demonstrates decreased strength, but full functional ROM. Patient will benefit from skilled PT to address below impairments, limitations and improve overall function.   OBJECTIVE IMPAIRMENTS: decreased activity tolerance, decreased shoulder mobility, decreased ROM, decreased strength, impaired flexibility, impaired UE use, postural dysfunction, and pain.   ACTIVITY LIMITATIONS: reaching, lifting, carry,  cleaning, driving, and or occupation   PERSONAL FACTORS:  also affecting patient's functional outcome.   REHAB POTENTIAL: Good   CLINICAL DECISION MAKING: Stable/uncomplicated   EVALUATION COMPLEXITY: Low       GOALS: Short term PT Goals Target date: 07/03/2022 Pt will be I and compliant with HEP. Baseline:  Goal status: New Pt will decrease pain by 25% overall Baseline: Goal status: New   Long term PT goals Target date: 07/31/2022 Pt will improve  Rt shoulder strength to at least 4+/5 MMT to improve functional strength Baseline: Goal status: New Pt will report minimal to no N/T in his R shoulder.  Baseline: Goal status: New Pt will reduce pain to overall less than 3/10 with usual activity  and work  activity. Baseline: Goal status: New   PLAN: PT FREQUENCY: 1-2x per week.    PT DURATION: 8 weeks.    PLANNED INTERVENTIONS (unless contraindicated): aquatic PT, Canalith repositioning, cryotherapy, Electrical stimulation, Iontophoresis with 4 mg/ml dexamethasome, Moist heat, traction, Ultrasound, gait training, Therapeutic exercise, balance training, neuromuscular re-education, patient/family education, prosthetic training, manual techniques, passive ROM, dry needling, taping, vasopnuematic device, vestibular, spinal manipulations, joint manipulations   PLAN FOR NEXT SESSION: Assess HEP/update PRN, continue to progress functional mobility without pain, strengthen parascapular muscles.      8:39 AM, 06/12/22 Tereasa Coop, DPT Physical Therapy with [facility]

## 2022-06-20 ENCOUNTER — Ambulatory Visit: Payer: 59 | Admitting: Physical Therapy

## 2022-06-20 ENCOUNTER — Encounter: Payer: Self-pay | Admitting: Physical Therapy

## 2022-06-20 DIAGNOSIS — R293 Abnormal posture: Secondary | ICD-10-CM | POA: Diagnosis not present

## 2022-06-20 DIAGNOSIS — M25511 Pain in right shoulder: Secondary | ICD-10-CM | POA: Diagnosis not present

## 2022-06-20 DIAGNOSIS — G8929 Other chronic pain: Secondary | ICD-10-CM

## 2022-06-20 DIAGNOSIS — M6281 Muscle weakness (generalized): Secondary | ICD-10-CM

## 2022-06-20 NOTE — Therapy (Addendum)
OUTPATIENT PHYSICAL THERAPY TREATMENT NOTE PHYSICAL THERAPY DISCHARGE SUMMARY  Visits from Start of Care: 2  Current functional level related to goals / functional outcomes: Unable to assess due to unplanned discharge    Remaining deficits: Unable to assess due to unplanned discharge    Education / Equipment: Unable to assess due to unplanned discharge    Patient agrees to discharge. Patient goals were  not reassessed  . Patient is being discharged due to not returning since the last visit.  2:30 PM, 11/23/22 Jerene Pitch, DPT Physical Therapy with [facility]    Patient Name: Leroy Johnson MRN: [id number] DOB:[birth year], 41 y.o., male Today's Date: 06/20/2022     END OF SESSION:   PT End of Session - 06/20/22 1224     Visit Number 2    Number of Visits 8    Date for PT Re-Evaluation 08/07/22    PT Start Time 1226   pt late to apt   PT Stop Time 1256    PT Time Calculation (min) 30 min    Activity Tolerance Patient tolerated treatment well    Behavior During Therapy [facility] for tasks assessed/performed             History reviewed. No pertinent past medical history. History reviewed. No pertinent surgical history. Patient Active Problem List   Diagnosis Date Noted   Dream enactment behavior 05/30/2022   Inadequate sleep hygiene 05/30/2022   Episodic sleep-wake schedule disorder, delayed phase type 05/30/2022   Snoring 05/30/2022   Excessive daytime sleepiness 05/30/2022   Right shoulder pain 04/28/2022   Parasomnia 04/28/2022   Blood glucose abnormal 03/01/2021   Dyslipidemia 03/01/2021   PCP: Vivi Barrack, MD   REFERRING PROVIDER: Gregor Hams, MD   REFERRING DIAG: Cervical disc disorder with radiculopathy of cervical region [M50.10]   THERAPY DIAG:  Chronic right shoulder pain - Plan: PT plan of care cert/re-cert   Muscle weakness (generalized) - Plan: PT plan of care cert/re-cert   Abnormal posture - Plan: PT plan of care  cert/re-cert   Rationale for Evaluation and Treatment Rehabilitation   ONSET DATE: 2 years   SUBJECTIVE:                                                                                                                                                                                                          SUBJECTIVE STATEMENT: 06/20/2022 States that he doesn't have any pain with the exercises but still has pain with his arm on the  Eval: Pt presents to PT with c/o R  shoulder pain ongoing 2 year. Pt is R-hand dominate. Pt does not recall a mechanism of cause of the pain. Pt locates pain to all over R GH joint. Pt works as a Financial planner. Pain is worse with a seated position with his arm down at his side or when resting his arm on an arm rest. Pt reports numbness/ tingling that radiates down to his elbow.    PERTINENT HISTORY:  None   PAIN:  Are you having pain? Yes: NPRS scale: 6-7/10 Pain location: R shoulder into pec and labrum.  Pain description: Sharp pain Aggravating factors: Resting arm by his side.  Relieving factors: Nothing.    PRECAUTIONS: None   WEIGHT BEARING RESTRICTIONS No   FALLS:  Has patient fallen in last 6 months? No   LIVING ENVIRONMENT: Lives with: lives with their family Lives in: House/apartment Stairs: No Has following equipment at home: None   OCCUPATION: Financial planner   PLOF: Independent   PATIENT GOALS Pt would like to reduce the pain in his shoulder.    OBJECTIVE:    DIAGNOSTIC FINDINGS:  Minimal glenohumeral osteoarthritis.     COGNITION: Overall cognitive status: Within functional limits for tasks assessed     SENSATION: WFL   POSTURE: rounded shoulders and forward head   PALPATION: No tenderness to palpation surrounding R GHJ.              CERVICAL ROM:    Active ROM A/PROM (deg) eval  Flexion WFL  Extension WFL  Right lateral flexion WFL  Left lateral flexion WFL  Right rotation WFL  Left rotation WFL   (Blank  rows = not tested)   UPPER EXTREMITY ROM:   Active ROM Right eval Left eval  Shoulder flexion [facility]  Shoulder extension [facility]  Shoulder abduction [facility]  Shoulder adduction      Shoulder internal rotation [facility] WFL  Shoulder external rotation WFL WFL   (Blank rows = not tested)   UPPER EXTREMITY MMT:   MMT Right eval Left eval  Shoulder flexion 3+ 3+  Shoulder extension      Shoulder abduction 4 4  Shoulder adduction      Shoulder extension      Shoulder internal rotation 4- 4-  Shoulder external rotation 4- 4-  Middle trapezius      Lower trapezius       (Blank rows = not tested)   CERVICAL SPECIAL TESTS:  Upper limb tension test (ULTT): Negative     TODAY'S TREATMENT:  06/20/2022  Therapeutic Exercise:  Aerobic: Supine: Prone:  Seated:  Standing: shoulder ER with towel roll between elbow/body x30 5" holds B, shoulder extension with towel x30 10" holds B, review of HEP Neuromuscular Re-education:on posture and ergonomic set up on adjusting chair/desk heght on reducing pressure on right UE, and thoracic towel roll down spine for increased shoulder ROM - 15 minutes Manual Therapy: Therapeutic Activity: Self Care: Trigger Point Dry Needling:  Modalities:    Creating, reviewing, and completing below HEP     PATIENT EDUCATION:  Education details: on posture, on ergonomic set up on reducing stress on right UE with sitting Person educated: Patient Education method: Explanation Education comprehension: verbalized understanding and returned demonstration     HOME EXERCISE PROGRAM:   Access Code: [email or web address] URL: [email or web address] Date: 06/05/2022 Prepared by: Rudi Heap     ASSESSMENT:   CLINICAL IMPRESSION:  06/20/2022 Session focused on education and review of sitting posture while at work. Excessive right UE  pressure noted on arm rest causing shoulder pain. Educated patietn on current presentation and plan/ways to address this.  Added exercises to HEP and reviewed previous exercises. Will continue with current POC as tolerated.  Eval: Patient referred to PT for R shoulder pain with N/T into elbow. Pt demonstrates decreased strength, but full functional ROM. Patient will benefit from skilled PT to address below impairments, limitations and improve overall function.   OBJECTIVE IMPAIRMENTS: decreased activity tolerance, decreased shoulder mobility, decreased ROM, decreased strength, impaired flexibility, impaired UE use, postural dysfunction, and pain.   ACTIVITY LIMITATIONS: reaching, lifting, carry,  cleaning, driving, and or occupation   PERSONAL FACTORS:  also affecting patient's functional outcome.   REHAB POTENTIAL: Good   CLINICAL DECISION MAKING: Stable/uncomplicated   EVALUATION COMPLEXITY: Low       GOALS: Short term PT Goals Target date: 07/03/2022 Pt will be I and compliant with HEP. Baseline:  Goal status: New Pt will decrease pain by 25% overall Baseline: Goal status: New   Long term PT goals Target date: 07/31/2022 Pt will improve  Rt shoulder strength to at least 4+/5 MMT to improve functional strength Baseline: Goal status: New Pt will report minimal to no N/T in his R shoulder.  Baseline: Goal status: New Pt will reduce pain to overall less than 3/10 with usual activity and work activity. Baseline: Goal status: New   PLAN: PT FREQUENCY: 1-2x per week.    PT DURATION: 8 weeks.    PLANNED INTERVENTIONS (unless contraindicated): aquatic PT, Canalith repositioning, cryotherapy, Electrical stimulation, Iontophoresis with 4 mg/ml dexamethasome, Moist heat, traction, Ultrasound, gait training, Therapeutic exercise, balance training, neuromuscular re-education, patient/family education, prosthetic training, manual techniques, passive ROM, dry needling, taping, vasopnuematic device, vestibular, spinal manipulations, joint manipulations   PLAN FOR NEXT SESSION: Assess HEP/update PRN, continue  to progress functional mobility without pain, strengthen parascapular muscles.      2:03 PM, 06/20/22 Jerene Pitch, DPT Physical Therapy with [facility]

## 2022-06-27 ENCOUNTER — Encounter: Payer: 59 | Admitting: Physical Therapy

## 2022-07-04 ENCOUNTER — Encounter: Payer: 59 | Admitting: Physical Therapy

## 2022-07-19 ENCOUNTER — Ambulatory Visit
Admission: RE | Admit: 2022-07-19 | Discharge: 2022-07-19 | Disposition: A | Discharge status: Home / Self Care | Payer: No Typology Code available for payment source | Source: Ambulatory Visit | Attending: Family Medicine | Admitting: Family Medicine

## 2022-07-19 DIAGNOSIS — E785 Hyperlipidemia, unspecified: Secondary | ICD-10-CM

## 2022-07-19 DIAGNOSIS — Z0001 Encounter for general adult medical examination with abnormal findings: Secondary | ICD-10-CM

## 2022-07-21 NOTE — Progress Notes (Signed)
Please inform patient of the following:  Great news!  His cardiac CT scan is normal.  No signs of calcification.  He should keep up the good work and we can recheck again in 5 to 10 years.

## 2022-08-01 ENCOUNTER — Telehealth: Payer: Self-pay | Admitting: Neurology

## 2022-08-01 NOTE — Telephone Encounter (Signed)
NPSG w/ RBD (no auth req'd per [facility] ref# [id number])   Patient is scheduled at [facility] for 10/08/22 at 8 pm.  Mailed packet to the patient.

## 2022-10-08 ENCOUNTER — Ambulatory Visit (INDEPENDENT_AMBULATORY_CARE_PROVIDER_SITE_OTHER): Payer: 59 | Admitting: Neurology

## 2022-10-08 DIAGNOSIS — G4721 Circadian rhythm sleep disorder, delayed sleep phase type: Secondary | ICD-10-CM

## 2022-10-08 DIAGNOSIS — G4719 Other hypersomnia: Secondary | ICD-10-CM

## 2022-10-08 DIAGNOSIS — G4752 REM sleep behavior disorder: Secondary | ICD-10-CM

## 2022-10-08 DIAGNOSIS — R0683 Snoring: Secondary | ICD-10-CM

## 2022-10-08 DIAGNOSIS — G4720 Circadian rhythm sleep disorder, unspecified type: Secondary | ICD-10-CM

## 2022-10-08 DIAGNOSIS — Z72821 Inadequate sleep hygiene: Secondary | ICD-10-CM

## 2022-10-08 DIAGNOSIS — G4733 Obstructive sleep apnea (adult) (pediatric): Secondary | ICD-10-CM

## 2022-10-11 ENCOUNTER — Telehealth: Payer: Self-pay | Admitting: Neurology

## 2022-10-11 NOTE — Telephone Encounter (Signed)
Called pt at [phone]. LVM for him to call office.

## 2022-10-11 NOTE — Telephone Encounter (Signed)
This patient saw Dr. Brett Fairy for sleep evaluation on 05/30/22.  I read the PSG from 10/08/22 on Dr. Edwena Felty behalf:   Please call and notify the patient that the recent sleep study showed overall very mild/borderline obstructive sleep apnea. If he would like to try treatment with an autoPAP machine, I would be happy to prescribe a CPAP-like machine for home use. Other treatment options include avoiding sleeping on the back with some weight loss (if possible, he is of course not obese), or the use of an oral appliance through a dentist. His sleep apnea was more noticeable during REM/dream sleep. Let me know how he would like to proceed.   Star Age, MD, PhD [facility])

## 2022-10-11 NOTE — Procedures (Signed)
Physician Interpretation (Study was interpreted on behalf of Dr. Brett Fairy):     [facility] POLYSOMNOGRAPHY  INTERPRETATION REPORT   STUDY DATE:  10/08/2022     PATIENT NAME:  Leroy Johnson         DATE OF BIRTH:  1981/06/22  PATIENT ID:  [id number]    TYPE OF STUDY:  PSG  READING PHYSICIAN: Star Age, MD, PhD   SCORING TECHNICIAN: Richard Miu, RPSGT   Referred by: Vivi Barrack, MD   History and Indication for Testing: 42 year old male with an underlying benign medical history, who reports vivid dreams and acting out his dreams. He reports nonrestorative sleep and snoring. Epworth sleepiness score is 3 out of 24. He is currently not taking melatonin. Height: 69 in Weight: 176 lb (BMI 25) Neck Size: 16 in   MEDICATIONS: Neurontin, Prednisone  TECHNICAL DESCRIPTION: A registered sleep technologist was in attendance for the duration of the recording.  Data collection, scoring, video monitoring, and reporting were performed in compliance with the AASM Manual for the Scoring of Sleep and Associated Events; (Hypopnea is scored based on the criteria listed in Section VIII D. 1b in the AASM Manual V2.6 using a 4% oxygen desaturation rule or Hypopnea is scored based on the criteria listed in Section VIII D. 1a in the AASM Manual V2.6 using 3% oxygen desaturation and /or arousal rule).   SLEEP CONTINUITY AND SLEEP ARCHITECTURE:  Lights-out was at 20:57: and lights-on at  05:01:, with a total recording time of 8 hours, 4 min. Total sleep time ( TST) was 410.5 minutes with a sleep efficiency of 84.8%.   BODY POSITION:  TST was divided  between the following sleep positions: 97.6% supine;  2.4% lateral;  0% prone. Duration of total sleep and percent of total sleep in their respective position is as follows: supine 400 minutes (98%), non-supine 10 minutes (2%); right 00 minutes (0%), left 10 minutes (2%), and prone 00 minutes (0%).  Total supine REM sleep  time was 53 minutes (100% of total REM sleep).  Sleep latency was normal at 27.5 minutes.  REM sleep latency was normal at 67.5 minutes. Of the total sleep time, the percentage of stage N1 sleep was 10.1%, which is increased, stage N2 sleep was 70%, which is increased, stage N3 sleep was 7.1%, and REM sleep was 13.0%, which is decreased. Wake after sleep onset (WASO) time accounted for 46 minutes with mild to moderate sleep fragmentation noted.   RESPIRATORY MONITORING:  Based on CMS criteria (using a 4% oxygen desaturation rule for scoring hypopneas), there were 4 apneas (4 obstructive; 0 central; 0 mixed), and 21 hypopneas.  Apnea index was 0.6. Hypopnea index was 3.1. The apnea-hypopnea index was 3.7 overall (3.7 supine, 0 non-supine; 23.6 REM, 23.6 supine REM). There were 0 respiratory effort-related arousals (RERAs).  The RERA index was 0 events/h. Total respiratory disturbance index (RDI) was 3.7 events/h. RDI results showed: supine RDI  3.7 /h; non-supine RDI 0.0 /h; REM RDI 23.6 /h, supine REM RDI 23.6 /h.   Based on AASM criteria (using a 3% oxygen desaturation and /or arousal rule for scoring hypopneas), there were 4 apneas (4 obstructive; 0 central; 0 mixed), and 30 hypopneas. Apnea index was 0.6. Hypopnea index was 4.4. The apnea-hypopnea index was 5.0/hour overall (4.9 supine, 0 non-supine; 29.2 REM, 29.2 supine REM).  There were 0 respiratory effort-related arousals (RERAs).  The RERA index was 0 events/h. Total respiratory disturbance index (RDI) was 5.0 events/h.  RDI results showed: supine RDI  4.9 /h; non-supine RDI 6.0 /h; REM RDI 29.2 /h, supine REM RDI 29.2 /h.   OXIMETRY: Oxyhemoglobin Saturation Nadir during sleep was at  88%) from a mean of 95%.  Of the Total sleep time (TST)   hypoxemia (=<88%) was present for  0.1 minutes, or 0.0% of total sleep time.   LIMB MOVEMENTS: There were 0 periodic limb movements of sleep (0.0/hr), of which 0 (0.0/hr) were associated with an  arousal.  AROUSAL: There were 40 arousals in total, for an arousal index of 6 arousals/hour.  Of these, 9 were identified as respiratory-related arousals (1 /h), 0 were PLM-related arousals (0 /h), and 46 were non-specific arousals (7 /h).  EEG: Review of the EEG showed no abnormal electrical discharges and symmetrical bihemispheric findings.    EKG: The EKG revealed normal sinus rhythm (NSR). The average heart rate during sleep was 73 bpm.  AUDIO/VIDEO REVIEW: The audio and video review did not show any abnormal or unusual behaviors, movements, phonations or vocalizations. The patient took no restroom breaks.  Mild to moderate snoring was detected.  POST-STUDY QUESTIONNAIRE: Post study, the patient indicated, that sleep was the same as usual.   IMPRESSION:  1. Mild Obstructive Sleep Apnea (OSA) 2. Dysfunctions associated with sleep stages or arousal from sleep  RECOMMENDATIONS:  1. This overnight sleep study demonstrates borderline obstructive sleep disordered breathing with an AHI of 5/hour.  He had more pronounced respiratory events during REM sleep with a REM AHI of 29.2/h.  O2 nadir was 88% with mild to moderate snoring detected. Treatment with positive airway pressure can be considered with autoPAP, if desired by patient. Treatment options otherwise include weight loss (where clinically appropriate) and avoidance of the supine sleep position or a dental device, in selected patients. These different avenues will be discussed with the patient.  2. This study shows sleep fragmentation and abnormal sleep stage percentages; these are nonspecific findings and per se do not signify an intrinsic sleep disorder or a cause for the patient's sleep-related symptoms. Causes include (but are not limited to) the first night effect of the sleep study, circadian rhythm disturbances, medication effect or an underlying mood disorder or medical problem.  No significant movements or vocalizations were noted  during the study, no evidence of behavioral changes, movements or vocalizations specifically during REM sleep.  3. The patient should be cautioned not to drive, work at heights, or operate dangerous or heavy equipment when tired or sleepy. Review and reiteration of good sleep hygiene measures should be pursued with any patient. 4. The patient will be seen in follow-up in the sleep clinic at [facility] for discussion of the test results, symptom and treatment compliance review, further management strategies, etc. The referring provider will be notified of the test results.   I certify that I have reviewed the entire raw data recording prior to the issuance of this report in accordance with the Standards of Accreditation of the American Academy of Sleep Medicine (AASM).  Huston Foley, MD, PhD Medical Director, [facility]) Diplomat, ABPN (Neurology and Sleep)                         Technical Report:   General Information  Name: Marko, Skalski BMI: 25.99 Physician: Huston Foley, MD  ID: [id number] Height: 69.0 in Technician: Margaretann Loveless, RPSGT  Sex: Male Weight: 176.0 lb Record: [id number]  Age: 42 [12-19-1980] Date: 10/08/2022  Medical & Medication History    Edvardo Vanlanen is a 42 y.o. year old [place]- [place] male patient seen here as a referral from PCP on 05/30/2022 for a .parasomnia, sleep disorder. He has no past medical history on file. No surgeries, no medications. Wore braces. 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 Per patient his is not taking Melatonin. No restroom trips. EKG kept in NSR. Moderate snoring. All respiratory events scored with a 3% desat. The patient slept mostly supine. Respiratory events while in REM. No abnormal movement during REM. There were arousals associated with respiratory events while in  REM.     Lights out: 08:57:12 PM Lights on: 05:01:11 AM   Time Total Supine Side Prone Upright  Recording (TRT) 8h 4.34m 7h 43.17m 0h 21.45m 0h 0.22m 0h 0.69m  Sleep (TST) 6h 50.58m 6h 40.43m 0h 10.51m 0h 0.89m 0h 0.66m   Latency N1 N2 N3 REM Onset Per. Slp. Eff.  Actual 0h 0.48m 0h 3.4m 0h 26.72m 1h 7.64m 0h 27.25m 0h 27.54m 84.81%   Stg Dur Wake N1 N2 N3 REM  Total 73.5 41.5 286.5 29.0 53.5  Supine 62.5 39.5 278.5 29.0 53.5  Side 11.0 2.0 8.0 0.0 0.0  Prone 0.0 0.0 0.0 0.0 0.0  Upright 0.0 0.0 0.0 0.0 0.0   Stg % Wake N1 N2 N3 REM  Total 15.2 10.1 69.8 7.1 13.0  Supine 12.9 9.6 67.8 7.1 13.0  Side 2.3 0.5 1.9 0.0 0.0  Prone 0.0 0.0 0.0 0.0 0.0  Upright 0.0 0.0 0.0 0.0 0.0     Apnea Summary Sub Supine Side Prone Upright  Total 4 Total 4 4 0 0 0    REM 4 4 0 0 0    NREM 0 0 0 0 0  Obs 4 REM 4 4 0 0 0    NREM 0 0 0 0 0  Mix 0 REM 0 0 0 0 0    NREM 0 0 0 0 0  Cen 0 REM 0 0 0 0 0    NREM 0 0 0 0 0   Rera Summary Sub Supine Side Prone Upright  Total 0 Total 0 0 0 0 0    REM 0 0 0 0 0    NREM 0 0 0 0 0   Hypopnea Summary Sub Supine Side Prone Upright  Total 30 Total 30 29 1  0 0    REM 22 22 0 0 0    NREM 8 7 1  0 0   4% Hypopnea Summary Sub Supine Side Prone Upright  Total (4%) 21 Total 21 21 0 0 0    REM 17 17 0 0 0    NREM 4 4 0 0 0     AHI Total Obs Mix Cen  4.97 Apnea 0.58 0.58 0.00 0.00   Hypopnea 4.38 -- -- --  3.65 Hypopnea (4%) 3.07 -- -- --    Total Supine Side Prone Upright  Position AHI 4.97 4.94 6.00 0.00 0.00  REM AHI 29.16   NREM AHI 1.34   Position RDI 4.97 4.94 6.00 0.00 0.00  REM RDI 29.16   NREM RDI 1.34    4% Hypopnea Total Supine Side Prone Upright  Position AHI (4%) 3.65 3.75 0.00 0.00 0.00  REM AHI (4%) 23.55   NREM AHI (4%) 0.67   Position RDI (4%) 3.65  3.75 0.00 0.00 0.00  REM RDI (4%) 23.55   NREM RDI (4%) 0.67    Desaturation Information Threshold: 2% <100% <90% <80% <70% <60% <50% <40%  Supine 103.0 3.0 0.0 0.0 0.0 0.0 0.0  Side 15.0  0.0 0.0 0.0 0.0 0.0 0.0  Prone 0.0 0.0 0.0 0.0 0.0 0.0 0.0  Upright 0.0 0.0 0.0 0.0 0.0 0.0 0.0  Total 118.0 3.0 0.0 0.0 0.0 0.0 0.0  Index 15.5 0.4 0.0 0.0 0.0 0.0 0.0   Threshold: 3% <100% <90% <80% <70% <60% <50% <40%  Supine 37.0 3.0 0.0 0.0 0.0 0.0 0.0  Side 1.0 0.0 0.0 0.0 0.0 0.0 0.0  Prone 0.0 0.0 0.0 0.0 0.0 0.0 0.0  Upright 0.0 0.0 0.0 0.0 0.0 0.0 0.0  Total 38.0 3.0 0.0 0.0 0.0 0.0 0.0  Index 5.0 0.4 0.0 0.0 0.0 0.0 0.0   Threshold: 4% <100% <90% <80% <70% <60% <50% <40%  Supine 23.0 3.0 0.0 0.0 0.0 0.0 0.0  Side 0.0 0.0 0.0 0.0 0.0 0.0 0.0  Prone 0.0 0.0 0.0 0.0 0.0 0.0 0.0  Upright 0.0 0.0 0.0 0.0 0.0 0.0 0.0  Total 23.0 3.0 0.0 0.0 0.0 0.0 0.0  Index 3.0 0.4 0.0 0.0 0.0 0.0 0.0   Threshold: 3% <100% <90% <80% <70% <60% <50% <40%  Supine 37 3 0 0 0 0 0  Side 1 0 0 0 0 0 0  Prone 0 0 0 0 0 0 0  Upright 0 0 0 0 0 0 0  Total 38 3 0 0 0 0 0   Awakening/Arousal Information # of Awakenings 30  Wake after sleep onset 46.41m  Wake after persistent sleep 46.103m   Arousal Assoc. Arousals Index  Apneas 0 0.0  Hypopneas 9 1.3  Leg Movements 0 0.0  Snore 0 0.0  PTT Arousals 0 0.0  Spontaneous 46 6.7  Total 55 8.0  Leg Movement Information PLMS LMs Index  Total LMs during PLMS 0 0.0  LMs w/ Microarousals 0 0.0   LM LMs Index  w/ Microarousal 0 0.0  w/ Awakening 1 0.1  w/ Resp Event 0 0.0  Spontaneous 3 0.4  Total 3 0.4     Desaturation threshold setting: 3% Minimum desaturation setting: 10 seconds SaO2 nadir: 88% The longest event was a 50 sec obstructive Hypopnea with a minimum SaO2 of 92%. The lowest SaO2 was 88% associated with a 38 sec obstructive Hypopnea. EKG Rates EKG Avg Max Min  Awake 81 98 65  Asleep 73 100 62  EKG Events: Tachycardia

## 2022-10-16 NOTE — Telephone Encounter (Signed)
Called and spoke with pt. Relayed results. He verbalized understanding. He will call back tomorrow on how he wants to proceed with treatment.

## 2023-07-17 ENCOUNTER — Ambulatory Visit (INDEPENDENT_AMBULATORY_CARE_PROVIDER_SITE_OTHER): Payer: 59 | Admitting: Family Medicine

## 2023-07-17 ENCOUNTER — Encounter: Payer: Self-pay | Admitting: Family Medicine

## 2023-07-17 VITALS — BP 106/66 | HR 74 | Temp 97.3°F | Ht 69.0 in | Wt 177.0 lb

## 2023-07-17 DIAGNOSIS — E785 Hyperlipidemia, unspecified: Secondary | ICD-10-CM | POA: Diagnosis not present

## 2023-07-17 DIAGNOSIS — R7309 Other abnormal glucose: Secondary | ICD-10-CM | POA: Diagnosis not present

## 2023-07-17 DIAGNOSIS — Z0001 Encounter for general adult medical examination with abnormal findings: Secondary | ICD-10-CM | POA: Diagnosis not present

## 2023-07-17 DIAGNOSIS — R21 Rash and other nonspecific skin eruption: Secondary | ICD-10-CM | POA: Insufficient documentation

## 2023-07-17 LAB — LIPID PANEL
Cholesterol: 214 mg/dL — ABNORMAL HIGH (ref 0–200)
HDL: 48.1 mg/dL (ref >39.00)
LDL Cholesterol: 143 mg/dL — ABNORMAL HIGH (ref 0–99)
NonHDL: 165.63
Total CHOL/HDL Ratio: 4
Triglycerides: 112 mg/dL (ref 0.0–149.0)
VLDL: 22.4 mg/dL (ref 0.0–40.0)

## 2023-07-17 LAB — CBC
HCT: 45 % (ref 39.0–52.0)
Hemoglobin: 14.7 g/dL (ref 13.0–17.0)
MCHC: 32.6 g/dL (ref 30.0–36.0)
MCV: 86.1 fL (ref 78.0–100.0)
Platelets: 222 10*3/uL (ref 150.0–400.0)
RBC: 5.22 Mil/uL (ref 4.22–5.81)
RDW: 13.9 % (ref 11.5–15.5)
WBC: 6.9 10*3/uL (ref 4.0–10.5)

## 2023-07-17 LAB — COMPREHENSIVE METABOLIC PANEL
ALT: 45 U/L (ref 0–53)
AST: 32 U/L (ref 0–37)
Albumin: 4.9 g/dL (ref 3.5–5.2)
Alkaline Phosphatase: 51 U/L (ref 39–117)
BUN: 17 mg/dL (ref 6–23)
CO2: 27 meq/L (ref 19–32)
Calcium: 10 mg/dL (ref 8.4–10.5)
Chloride: 103 meq/L (ref 96–112)
Creatinine, Ser: 1.15 mg/dL (ref 0.40–1.50)
GFR: 77.48 mL/min (ref >60.00)
Glucose, Bld: 90 mg/dL (ref 70–99)
Potassium: 4.2 meq/L (ref 3.5–5.1)
Sodium: 140 meq/L (ref 135–145)
Total Bilirubin: 1.2 mg/dL (ref 0.2–1.2)
Total Protein: 7.9 g/dL (ref 6.0–8.3)

## 2023-07-17 LAB — TSH: TSH: 1.55 u[IU]/mL (ref 0.35–5.50)

## 2023-07-17 LAB — HEMOGLOBIN A1C: Hgb A1c MFr Bld: 5.7 % (ref 4.6–6.5)

## 2023-07-17 MED ORDER — CLOBETASOL PROPIONATE 0.05 % EX OINT: 1.0000 | TOPICAL_OINTMENT | Freq: Two times a day (BID) | CUTANEOUS | 0 refills | Status: DC

## 2023-07-17 NOTE — Patient Instructions (Addendum)
It was very nice to see you today!  Please try the clobetasol to the rash on your knee.  Do this twice daily for the next week or 2 and let us know if not proving  Please continue to work on diet and exercise.  Will see back in year for your physical.  Come back sooner if needed.  Return in about 1 year (around 07/16/2024) for Annual Physical.   Take care, Dr Jimmey Ralph  PLEASE NOTE:  If you had any lab tests, please let us know if you have not heard back within a few days. You may see your results on mychart before we have a chance to review them but we will give you a call once they are reviewed by [place].   If we ordered any referrals today, please let us know if you have not heard from their office within the next week.   If you had any urgent prescriptions sent in today, please check with the pharmacy within an hour of our visit to make sure the prescription was transmitted appropriately.   Please try these tips to maintain a healthy lifestyle:  Eat at least 3 REAL meals and 1-2 snacks per day.  Aim for no more than 5 hours between eating.  If you eat breakfast, please do so within one hour of getting up.   Each meal should contain half fruits/vegetables, one quarter protein, and one quarter carbs (no bigger than a computer mouse)  Cut down on sweet beverages. This includes juice, soda, and sweet tea.   Drink at least 1 glass of water with each meal and aim for at least 8 glasses per day  Exercise at least 150 minutes every week.    Preventive Care 75-75 Years Old, Male Preventive care refers to lifestyle choices and visits with your health care provider that can promote health and wellness. Preventive care visits are also called wellness exams. What can I expect for my preventive care visit? Counseling During your preventive care visit, your health care provider may ask about your: Medical history, including: Past medical problems. Family medical history. Current health,  including: Emotional well-being. Home life and relationship well-being. Sexual activity. Lifestyle, including: Alcohol, nicotine or tobacco, and drug use. Access to firearms. Diet, exercise, and sleep habits. Safety issues such as seatbelt and bike helmet use. Sunscreen use. Work and work Astronomer. Physical exam Your health care provider will check your: Height and weight. These may be used to calculate your BMI (body mass index). BMI is a measurement that tells if you are at a healthy weight. Waist circumference. This measures the distance around your waistline. This measurement also tells if you are at a healthy weight and may help predict your risk of certain diseases, such as type 2 diabetes and high blood pressure. Heart rate and blood pressure. Body temperature. Skin for abnormal spots. What immunizations do I need?  Vaccines are usually given at various ages, according to a schedule. Your health care provider will recommend vaccines for you based on your age, medical history, and lifestyle or other factors, such as travel or where you work. What tests do I need? Screening Your health care provider may recommend screening tests for certain conditions. This may include: Lipid and cholesterol levels. Diabetes screening. This is done by checking your blood sugar (glucose) after you have not eaten for a while (fasting). Hepatitis B test. Hepatitis C test. HIV (human immunodeficiency virus) test. STI (sexually transmitted infection) testing, if you are at  risk. Lung cancer screening. Prostate cancer screening. Colorectal cancer screening. Talk with your health care provider about your test results, treatment options, and if necessary, the need for more tests. Follow these instructions at home: Eating and drinking  Eat a diet that includes fresh fruits and vegetables, whole grains, lean protein, and low-fat dairy products. Take vitamin and mineral supplements as recommended  by your health care provider. Do not drink alcohol if your health care provider tells you not to drink. If you drink alcohol: Limit how much you have to 0-2 drinks a day. Know how much alcohol is in your drink. In the [place] one drink equals one 12 oz bottle of beer (355 mL), one 5 oz glass of wine (148 mL), or one 1 oz glass of hard liquor (44 mL). Lifestyle Brush your teeth every morning and night with fluoride toothpaste. Floss one time each day. Exercise for at least 30 minutes 5 or more days each week. Do not use any products that contain nicotine or tobacco. These products include cigarettes, chewing tobacco, and vaping devices, such as e-cigarettes. If you need help quitting, ask your health care provider. Do not use drugs. If you are sexually active, practice safe sex. Use a condom or other form of protection to prevent STIs. Take aspirin only as told by your health care provider. Make sure that you understand how much to take and what form to take. Work with your health care provider to find out whether it is safe and beneficial for you to take aspirin daily. Find healthy ways to manage stress, such as: Meditation, yoga, or listening to music. Journaling. Talking to a trusted person. Spending time with friends and family. Minimize exposure to UV radiation to reduce your risk of skin cancer. Safety Always wear your seat belt while driving or riding in a vehicle. Do not drive: If you have been drinking alcohol. Do not ride with someone who has been drinking. When you are tired or distracted. While texting. If you have been using any mind-altering substances or drugs. Wear a helmet and other protective equipment during sports activities. If you have firearms in your house, make sure you follow all gun safety procedures. What's next? Go to your health care provider once a year for an annual wellness visit. Ask your health care provider how often you should have your eyes and teeth  checked. Stay up to date on all vaccines. This information is not intended to replace advice given to you by your health care provider. Make sure you discuss any questions you have with your health care provider. Document Revised: 03/09/2021 Document Reviewed: 03/09/2021 Elsevier Patient Education  2024 [employer].

## 2023-07-17 NOTE — Assessment & Plan Note (Signed)
Rash consistent with potential psoriasis.  Will start topical clobetasol.  He will let us know if not proving next few weeks and would consider referral to dermatology versus biopsy.

## 2023-07-17 NOTE — Assessment & Plan Note (Signed)
Check lipids. Discussed lifestyle modifications.  

## 2023-07-17 NOTE — Progress Notes (Signed)
Chief Complaint:  Leroy Johnson is a 42 y.o. male who presents today for his annual comprehensive physical exam.    Assessment/Plan:  9Chronic Problems Addressed Today: Rash Rash consistent with potential psoriasis.  Will start topical clobetasol.  He will let us know if not proving next few weeks and would consider referral to dermatology versus biopsy.  Blood glucose abnormal Check A1c.   Dyslipidemia Check lipids.  Discussed lifestyle modifications.   Preventative Healthcare: Check labs.  Due for colon cancer screening next year.  Patient Counseling(The following topics were reviewed and/or handout was given):  -Nutrition: Stressed importance of moderation in sodium/caffeine intake, saturated fat and cholesterol, caloric balance, sufficient intake of fresh fruits, vegetables, and fiber.  -Stressed the importance of regular exercise.   -Substance Abuse: Discussed cessation/primary prevention of tobacco, alcohol, or other drug use; driving or other dangerous activities under the influence; availability of treatment for abuse.   -Injury prevention: Discussed safety belts, safety helmets, smoke detector, smoking near bedding or upholstery.   -Sexuality: Discussed sexually transmitted diseases, partner selection, use of condoms, avoidance of unintended pregnancy and contraceptive alternatives.   -Dental health: Discussed importance of regular tooth brushing, flossing, and dental visits.  -Health maintenance and immunizations reviewed. Please refer to Health maintenance section.  Return to care in 1 year for next preventative visit.     Subjective:  HPI:  He has no acute complaints today.   He has been having a rash on his knees for the last few months. No obvious injuries or precipitating events. No treatments tried.  Symptoms have been stable.  No pain.  No itchy.  Lifestyle Diet: Balanced. Plenty of fruits and vegetables.  Exercise: None specific.      07/17/2023     9:08 AM  Depression screen PHQ 2/9  Decreased Interest 0  Down, Depressed, Hopeless 0  PHQ - 2 Score 0    There are no preventive care reminders to display for this patient.   ROS: Per HPI, otherwise a complete review of systems was negative.   PMH:  The following were reviewed and entered/updated in epic: History reviewed. No pertinent past medical history. 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 No pertinent surgical history.  Family History  Problem Relation Age of Onset   Hypertension Mother    Diabetes Mother    Diabetes Father     Medications- reviewed and updated Current Outpatient Medications  Medication Sig Dispense Refill   clobetasol ointment (TEMOVATE) 0.05 % Apply 1 Application topically 2 (two) times daily. 30 g 0   No current facility-administered medications for this visit.    Allergies-reviewed and updated No Known Allergies  Social History   Socioeconomic History   Marital status: Married    Spouse name: Not on file   Number of children: 1   Years of education: Not on file   Highest education level: Not on file  Occupational History   Occupation: Sport and exercise psychologist  Tobacco Use   Smoking status: Former   Smokeless tobacco: Never  Vaping Use   Vaping status: Never Used  Substance and Sexual Activity   Alcohol use: Yes  Comment: Socially/Weekends   Drug use: No   Sexual activity: Yes    Partners: Female  Other Topics Concern   Not on file  Social History Narrative   Not on file   Social Determinants of Health   Financial Resource Strain: Not on file   Food Insecurity: Not on file  Transportation Needs: Not on file  Physical Activity: Not on file  Stress: Not on file  Social Connections: Not on file        Objective:  Physical Exam: BP 106/66   Pulse 74   Temp (!) 97.3 F (36.3 C) (Temporal)   Ht 5\' 9"  (1.753 m)   Wt 177 lb (80.3 kg)   SpO2 98%   BMI 26.14 kg/m   Body mass index is 26.14 kg/m. Wt Readings from Last 3 Encounters:  07/17/23 177 lb (80.3 kg)  05/30/22 176 lb (79.8 kg)  05/03/22 176 lb 6.4 oz (80 kg)   Gen: NAD, resting comfortably HEENT: TMs normal bilaterally. OP clear. No thyromegaly noted.  CV: RRR with no murmurs appreciated Pulm: NWOB, CTAB with no crackles, wheezes, or rhonchi GI: Normal bowel sounds present. Soft, Nontender, Nondistended. MSK: no edema, cyanosis, or clubbing noted Skin: warm, dry.  Try confluent hyperpigmented rash along anterior aspect of bilateral knees. Overlying silvery scale.  Neuro: CN2-12 grossly intact. Strength 5/5 in upper and lower extremities. Reflexes symmetric and intact bilaterally.  Psych: Normal affect and thought content     Lissy Deuser M. Jimmey Ralph, MD 07/17/2023 9:46 AM

## 2023-07-17 NOTE — Assessment & Plan Note (Signed)
Check A1c. 

## 2023-07-20 NOTE — Progress Notes (Signed)
Cholesterol is elevated but not to the point where he needs to start meds.  The rest of his labs are all stable.  He should continue to work on diet and exercise and we can recheck in a year.

## 2024-05-06 ENCOUNTER — Ambulatory Visit (INDEPENDENT_AMBULATORY_CARE_PROVIDER_SITE_OTHER): Admitting: Physician Assistant

## 2024-05-06 ENCOUNTER — Encounter: Payer: Self-pay | Admitting: Physician Assistant

## 2024-05-06 VITALS — BP 108/80 | HR 75 | Temp 98.4°F | Ht 69.0 in | Wt 163.6 lb

## 2024-05-06 DIAGNOSIS — R6884 Jaw pain: Secondary | ICD-10-CM

## 2024-05-06 NOTE — Progress Notes (Signed)
 Leroy Johnson is a 43 y.o. male here for a follow up of a pre-existing problem.  History of Present Illness:   Chief Complaint  Patient presents with   Ear Pain    Left ear. Noticed Saturday. States he thinks it could be due to wax. Tried a solution one time and it continued to hurt so decided to come to office.     Patient reports left ear pain x 4 days Has also had some pain to his left jaw Pain with opening mouth wide and chewing Does not have a dentist Reports there is no: fevers/chills, upper respiratory infection (URI) symptom(s) Has tried ear wax solution x 1 drop with no significant change in symptom(s)    No past medical history on file.   Social History   Tobacco Use   Smoking status: Former   Smokeless tobacco: Never  Advertising account planner   Vaping status: Never Used  Substance Use Topics   Alcohol use: Yes    Comment: Socially/Weekends   Drug use: No    No past surgical history on file.  Family History  Problem Relation Age of Onset   Hypertension Mother    Diabetes Mother    Diabetes Father     No Known Allergies  Current Medications:   Current Outpatient Medications:    clobetasol  ointment (TEMOVATE ) 0.05 %, Apply 1 Application topically 2 (two) times daily. (Patient not taking: Reported on 05/06/2024), Disp: 30 g, Rfl: 0   Review of Systems:   Negative unless otherwise specified per HPI.  Vitals:   Vitals:   05/06/24 1111  BP: 108/80  Pulse: 75  Temp: 98.4 F (36.9 C)  TempSrc: Axillary  SpO2: 99%  Weight: 163 lb 9.6 oz (74.2 kg)  Height: 5' 9 (1.753 m)     Body mass index is 24.16 kg/m.  Physical Exam:   Physical Exam Vitals and nursing note reviewed.  Constitutional:      General: He is not in acute distress.    Appearance: He is well-developed. He is not ill-appearing or toxic-appearing.  HENT:     Head: Normocephalic and atraumatic.     Right Ear: Tympanic membrane, ear canal and external ear normal. Tympanic membrane is not  erythematous, retracted or bulging.     Left Ear: Tympanic membrane, ear canal and external ear normal. Tympanic membrane is not erythematous, retracted or bulging.     Nose: Nose normal.     Right Sinus: No maxillary sinus tenderness or frontal sinus tenderness.     Left Sinus: No maxillary sinus tenderness or frontal sinus tenderness.     Mouth/Throat:     Pharynx: Uvula midline. No posterior oropharyngeal erythema.     Comments: Poor dentition to lower left molar Eyes:     General: Lids are normal.     Conjunctiva/sclera: Conjunctivae normal.     Pupils: Pupils are equal, round, and reactive to light.  Neck:     Trachea: Trachea normal.  Cardiovascular:     Heart sounds: S1 normal and S2 normal.  Pulmonary:     Effort: Pulmonary effort is normal.     Breath sounds: Normal breath sounds. No decreased breath sounds.  Musculoskeletal:        General: Normal range of motion.     Cervical back: Normal range of motion.  Lymphadenopathy:     Head:     Right side of head: No preauricular or posterior auricular adenopathy.     Left side of head: No  preauricular or posterior auricular adenopathy.     Cervical: No cervical adenopathy.  Skin:    General: Skin is warm and dry.  Neurological:     Mental Status: He is alert and oriented to person, place, and time.     GCS: GCS eye subscore is 4. GCS verbal subscore is 5. GCS motor subscore is 6.  Psychiatric:        Speech: Speech normal.        Behavior: Behavior normal. Behavior is cooperative.        Thought Content: Thought content normal.        Judgment: Judgment normal.     Assessment and Plan:    Jaw pain No red flags No evidence of ear infection or wax build-up Recommend trial anti-inflammatory such as ibuprofen or Advil to help with possible TMJ If no improvement in symptoms, recommend that he seeks care from a dentist We did provide instructions on gentle range of motion exercises If new or worsening symptoms,  recommend he reach out to us  to advise further in the interim  Lucie Buttner, PA-C

## 2024-05-06 NOTE — Patient Instructions (Signed)
 It was great to see you!  Trial daily anti-inflammatory such as ibuprofen or advil  Trial exercises  If no improvement, see dentist  Take care,  Lucie Buttner PA-C

## 2024-05-13 ENCOUNTER — Ambulatory Visit: Payer: Self-pay | Admitting: Family Medicine

## 2024-06-19 ENCOUNTER — Encounter: Payer: Self-pay | Admitting: Family

## 2024-06-19 ENCOUNTER — Ambulatory Visit (INDEPENDENT_AMBULATORY_CARE_PROVIDER_SITE_OTHER): Admitting: Family

## 2024-06-19 ENCOUNTER — Ambulatory Visit: Payer: Self-pay

## 2024-06-19 VITALS — BP 104/70 | HR 78 | Temp 98.2°F | Ht 69.0 in | Wt 170.1 lb

## 2024-06-19 DIAGNOSIS — K120 Recurrent oral aphthae: Secondary | ICD-10-CM | POA: Diagnosis not present

## 2024-06-19 NOTE — Telephone Encounter (Signed)
 FYI Only or Action Required?: FYI only for provider.  Patient was last seen in primary care on 05/06/2024 by Job Lukes, PA.  Called Nurse Triage reporting Oral Pain.  Symptoms began several days ago.  Interventions attempted: Nothing.  Symptoms are: unchanged.  Triage Disposition: See Physician Within 24 Hours  Patient/caregiver understands and will follow disposition?: Yes, will follow disposition  Copied from CRM #[id number]. Topic: Clinical - Red Word Triage >> Jun 19, 2024  8:25 AM Robinson H wrote: [facility] that prompted transfer to Nurse Triage: Mouth ulcer, pain in mouth, pain when swallowing Reason for Disposition  [1] One pimple or ulcer on the gum AND [2] near a toothache  Answer Assessment - Initial Assessment Questions 1. LOCATION: Where is the mouth sore (ulcer) located?      Bottom, inside the mouth 2. NUMBER: How many sores are there? :     unsure 3. SIZE: How large is the sore?  (e.g., size of an apple seed, watermelon seed, pencil eraser)     unsure 4. PAIN: Are they painful? If Yes, ask: How bad is it?  (Scale 0-10; or none, mild, moderate, severe)     Moderate and severe 5. ONSET: When did you first notice the sore?      2 days 6. RECURRENT SYMPTOM: Have you had a mouth ulcer before? If Yes, ask: When was the last time? and What happened that time?      denies 7. CAUSE: What do you think is causing the mouth sore?     unsure 8. OTHER SYMPTOMS: Do you have any other symptoms? (e.g., fever, swollen lymph node)     denies  Protocols used: Mouth Ulcers-A-AH

## 2024-06-19 NOTE — Telephone Encounter (Signed)
 Patient schedule with [facility] at 4 pm today.

## 2024-06-19 NOTE — Progress Notes (Signed)
   Patient ID: Leroy Johnson, male    DOB: 12-09-1980, 43 y.o.   MRN: [id number]  Chief Complaint  Patient presents with   Mouth Lesions  Discussed the use of AI scribe software for clinical note transcription with the patient, who gave verbal consent to proceed.  History of Present Illness   Leroy Johnson is a 43 year old male who presents with mouth sores and pain when swallowing.  He has sores on the gums causing pain when swallowing, specifically located in the back of the mouth, with no involvement of the tongue. Orajel, an over-the-counter topical anesthetic, did not provide significant relief. He has not applied any ointment directly to the sores due to difficulty of application. There is no fever or additional sores elsewhere in the mouth. No specific triggers for the sores have been identified, although stress and anxiety are considered potential contributing factors.     Assessment and Plan    Oral aphthous ulcer Atypical location but consistent with aphthous ulcer. Stress and anxiety may contribute. Expected to resolve with treatment. - Use OTC generic Peroxyl mouth rinse, gargle for one minute, three times daily for up to a week. - Avoid spicy, salty, sour, and acidic foods and beverages that could continue to irritate ulcer and delay healing. - Manage stress and anxiety. - Use warm salt water gargles as an alternative to Peroxyl rinse or use in between rinses. - Report if no improvement after a week.     Subjective:    Outpatient Medications Prior to Visit  Medication Sig Dispense Refill   clobetasol  ointment (TEMOVATE ) 0.05 % Apply 1 Application topically 2 (two) times daily. (Patient not taking: Reported on 05/06/2024) 30 g 0   No facility-administered medications prior to visit.   No past medical history on file. No past surgical history on file. No Known Allergies    Objective:    Physical Exam Vitals and nursing note reviewed.  Constitutional:       General: He is not in acute distress.    Appearance: Normal appearance.  HENT:     Head: Normocephalic.     Mouth/Throat:     Mouth: Oral lesions (posterior left pharynx, approx. 1.5cm in diameter ulcer with erythema) present.  Cardiovascular:     Rate and Rhythm: Normal rate and regular rhythm.  Pulmonary:     Effort: Pulmonary effort is normal.     Breath sounds: Normal breath sounds.  Musculoskeletal:        General: Normal range of motion.     Cervical back: Normal range of motion.  Skin:    General: Skin is warm and dry.  Neurological:     Mental Status: He is alert and oriented to person, place, and time.  Psychiatric:        Mood and Affect: Mood normal.    BP 104/70 (BP Location: Left Arm, Patient Position: Sitting)   Pulse 78   Temp 98.2 F (36.8 C) (Temporal)   Ht 5' 9 (1.753 m)   Wt 170 lb 2 oz (77.2 kg)   SpO2 99%   BMI 25.12 kg/m  Wt Readings from Last 3 Encounters:  06/19/24 170 lb 2 oz (77.2 kg)  05/06/24 163 lb 9.6 oz (74.2 kg)  07/17/23 177 lb (80.3 kg)       Lucius Krabbe, NP

## 2024-08-14 ENCOUNTER — Encounter: Payer: Self-pay | Admitting: Family Medicine

## 2024-08-14 ENCOUNTER — Ambulatory Visit (INDEPENDENT_AMBULATORY_CARE_PROVIDER_SITE_OTHER): Admitting: Family Medicine

## 2024-08-14 VITALS — BP 110/70 | HR 84 | Temp 97.2°F | Ht 69.0 in | Wt 174.6 lb

## 2024-08-14 DIAGNOSIS — R7309 Other abnormal glucose: Secondary | ICD-10-CM | POA: Diagnosis not present

## 2024-08-14 DIAGNOSIS — E785 Hyperlipidemia, unspecified: Secondary | ICD-10-CM

## 2024-08-14 DIAGNOSIS — Z0001 Encounter for general adult medical examination with abnormal findings: Secondary | ICD-10-CM

## 2024-08-14 LAB — LIPID PANEL
Cholesterol: 209 mg/dL — ABNORMAL HIGH (ref 0–200)
HDL: 37.1 mg/dL — ABNORMAL LOW (ref >39.00)
LDL Cholesterol: 133 mg/dL — ABNORMAL HIGH (ref 0–99)
NonHDL: 171.47
Total CHOL/HDL Ratio: 6
Triglycerides: 190 mg/dL — ABNORMAL HIGH (ref 0.0–149.0)
VLDL: 38 mg/dL (ref 0.0–40.0)

## 2024-08-14 LAB — COMPREHENSIVE METABOLIC PANEL WITH GFR
ALT: 34 U/L (ref 0–53)
AST: 21 U/L (ref 0–37)
Albumin: 4.8 g/dL (ref 3.5–5.2)
Alkaline Phosphatase: 48 U/L (ref 39–117)
BUN: 9 mg/dL (ref 6–23)
CO2: 32 meq/L (ref 19–32)
Calcium: 9.9 mg/dL (ref 8.4–10.5)
Chloride: 102 meq/L (ref 96–112)
Creatinine, Ser: 1.04 mg/dL (ref 0.40–1.50)
GFR: 86.76 mL/min (ref >60.00)
Glucose, Bld: 91 mg/dL (ref 70–99)
Potassium: 4.4 meq/L (ref 3.5–5.1)
Sodium: 139 meq/L (ref 135–145)
Total Bilirubin: 1.8 mg/dL — ABNORMAL HIGH (ref 0.2–1.2)
Total Protein: 7.4 g/dL (ref 6.0–8.3)

## 2024-08-14 LAB — CBC
HCT: 45 % (ref 39.0–52.0)
Hemoglobin: 14.9 g/dL (ref 13.0–17.0)
MCHC: 33 g/dL (ref 30.0–36.0)
MCV: 86.5 fl (ref 78.0–100.0)
Platelets: 219 K/uL (ref 150.0–400.0)
RBC: 5.2 Mil/uL (ref 4.22–5.81)
RDW: 14.1 % (ref 11.5–15.5)
WBC: 5.8 K/uL (ref 4.0–10.5)

## 2024-08-14 LAB — HEMOGLOBIN A1C: Hgb A1c MFr Bld: 5.6 % (ref 4.6–6.5)

## 2024-08-14 LAB — TSH: TSH: 1.43 u[IU]/mL (ref 0.35–5.50)

## 2024-08-14 NOTE — Assessment & Plan Note (Signed)
 Check A1c.  Discussed lifestyle modifications.

## 2024-08-14 NOTE — Progress Notes (Signed)
 Chief Complaint:  Leroy Johnson is a 43 y.o. male who presents today for his annual comprehensive physical exam.    Assessment/Plan:  Chronic Problems Addressed Today: Blood glucose abnormal Check A1c.  Discussed lifestyle modifications.  Dyslipidemia Check lipids.  Discussed lifestyle modifications.  Preventative Healthcare: Check labs.  Vaccines declined.  Due for colon cancer screening -we discussed colonoscopy versus Cologuard.  He would like to hold off on this for now as he will be moving to [place]  in a few months.  Advised him to let us  know if he needs any further assistance with this though did discuss general recommendation for colon cancer screening starting at age 49.  Patient Counseling(The following topics were reviewed and/or handout was given):  -Nutrition: Stressed importance of moderation in sodium/caffeine intake, saturated fat and cholesterol, caloric balance, sufficient intake of fresh fruits, vegetables, and fiber.  -Stressed the importance of regular exercise.   -Substance Abuse: Discussed cessation/primary prevention of tobacco, alcohol, or other drug use; driving or other dangerous activities under the influence; availability of treatment for abuse.   -Injury prevention: Discussed safety belts, safety helmets, smoke detector, smoking near bedding or upholstery.   -Sexuality: Discussed sexually transmitted diseases, partner selection, use of condoms, avoidance of unintended pregnancy and contraceptive alternatives.   -Dental health: Discussed importance of regular tooth brushing, flossing, and dental visits.  -Health maintenance and immunizations reviewed. Please refer to Health maintenance section.  Return to care in 1 year for next preventative visit.     Subjective:  HPI:  He has no acute complaints today. Patient is here today for his annual physical.  See assessment / plan for status of chronic conditions.    Discussed the use of AI scribe software  for clinical note transcription with the patient, who gave verbal consent to proceed.  History of Present Illness Leroy Johnson is a 43 year old male who presents for an annual physical exam.  He was last seen over a year ago and had a visit a couple of months ago for canker sores, which have since resolved. No current issues with mouth sores.  He is interested in checking his weight and overall health metrics during this visit.  He mentions difficulty in maintaining a regular eating schedule but includes vegetables in his diet at night and is trying to consume plenty of fruits and vegetables. However, he does not engage in much physical exercise.  He is considering colon cancer screening options as he has turned 43. He is open to having blood work done today to check cholesterol, blood sugar, kidney function, liver function, and electrolytes.  No changes in vision or hearing. He mentions a move to [place]  next month for a job change, which is a significant transition for him and his family.       08/14/2024    9:32 AM  Depression screen PHQ 2/9  Decreased Interest 0  Down, Depressed, Hopeless 0  PHQ - 2 Score 0    There are no preventive care reminders to display for this patient.   ROS: Per HPI, otherwise a complete review of systems was negative.   PMH:  The following were reviewed and entered/updated in epic: History reviewed. No pertinent past medical history. 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 No pertinent surgical history.  Family History  Problem Relation Age of Onset    Hypertension Mother    Diabetes Mother    Diabetes Father     Medications- reviewed and updated No current outpatient medications on file.   No current facility-administered medications for this visit.    Allergies-reviewed and updated No Known Allergies  Social History   Socioeconomic History   Marital status: Married    Spouse name: Not on file   Number of children: 1   Years of education: Not on file   Highest education level: Not on file  Occupational History   Occupation: Sport And Exercise Psychologist  Tobacco Use   Smoking status: Former   Smokeless tobacco: Never  Advertising Account Planner   Vaping status: Never Used  Substance and Sexual Activity   Alcohol use: Yes    Comment: Socially/Weekends   Drug use: No   Sexual activity: Yes    Partners: Female  Other Topics Concern   Not on file  Social History Narrative   Not on file   Social Drivers of Health   Financial Resource Strain: Low Risk  (05/06/2024)   Overall Financial Resource Strain (CARDIA)    Difficulty of Paying Living Expenses: Not hard at all  Food Insecurity: Not on file  Transportation Needs: Not on file  Physical Activity: Not on file  Stress: Not on file  Social Connections: Not on file        Objective:  Physical Exam: BP 110/70   Pulse 84   Temp (!) 97.2 F (36.2 C) (Temporal)   Ht 5' 9 (1.753 m)   Wt 174 lb 9.6 oz (79.2 kg)   SpO2 97%   BMI 25.78 kg/m   Body mass index is 25.78 kg/m. Wt Readings from Last 3 Encounters:  08/14/24 174 lb 9.6 oz (79.2 kg)  06/19/24 170 lb 2 oz (77.2 kg)  05/06/24 163 lb 9.6 oz (74.2 kg)   Gen: NAD, resting comfortably HEENT: TMs normal bilaterally. OP clear. No thyromegaly noted.  CV: RRR with no murmurs appreciated Pulm: NWOB, CTAB with no crackles, wheezes, or rhonchi GI: Normal bowel sounds present. Soft, Nontender, Nondistended. MSK: no edema, cyanosis, or clubbing noted Skin: warm, dry Neuro: CN2-12 grossly intact. Strength 5/5 in upper and lower  extremities. Reflexes symmetric and intact bilaterally.  Psych: Normal affect and thought content     Diannie Willner M. Kennyth, MD 08/14/2024 9:55 AM

## 2024-08-14 NOTE — Assessment & Plan Note (Signed)
 Check lipids. Discussed lifestyle modifications.

## 2024-08-14 NOTE — Patient Instructions (Signed)
 It was very nice to see you today!  Will check blood work today.  Please continue to work on diet and exercise.  Will see back in a year for your next physical.  Come in sooner if needed.  Return in about 1 year (around 08/14/2025) for Annual Physical.   Take care, Dr Kennyth  PLEASE NOTE:  If you had any lab tests, please let us  know if you have not heard back within a few days. You may see your results on mychart before we have a chance to review them but we will give you a call once they are reviewed by us .   If we ordered any referrals today, please let us  know if you have not heard from their office within the next week.   If you had any urgent prescriptions sent in today, please check with the pharmacy within an hour of our visit to make sure the prescription was transmitted appropriately.   Please try these tips to maintain a healthy lifestyle:  Eat at least 3 REAL meals and 1-2 snacks per day.  Aim for no more than 5 hours between eating.  If you eat breakfast, please do so within one hour of getting up.   Each meal should contain half fruits/vegetables, one quarter protein, and one quarter carbs (no bigger than a computer mouse)  Cut down on sweet beverages. This includes juice, soda, and sweet tea.   Drink at least 1 glass of water with each meal and aim for at least 8 glasses per day  Exercise at least 150 minutes every week.    Preventive Care 91-40 Years Old, Male Preventive care refers to lifestyle choices and visits with your health care provider that can promote health and wellness. Preventive care visits are also called wellness exams. What can I expect for my preventive care visit? Counseling During your preventive care visit, your health care provider may ask about your: Medical history, including: Past medical problems. Family medical history. Current health, including: Emotional well-being. Home life and relationship well-being. Sexual  activity. Lifestyle, including: Alcohol, nicotine or tobacco, and drug use. Access to firearms. Diet, exercise, and sleep habits. Safety issues such as seatbelt and bike helmet use. Sunscreen use. Work and work astronomer. Physical exam Your health care provider will check your: Height and weight. These may be used to calculate your BMI (body mass index). BMI is a measurement that tells if you are at a healthy weight. Waist circumference. This measures the distance around your waistline. This measurement also tells if you are at a healthy weight and may help predict your risk of certain diseases, such as type 2 diabetes and high blood pressure. Heart rate and blood pressure. Body temperature. Skin for abnormal spots. What immunizations do I need?  Vaccines are usually given at various ages, according to a schedule. Your health care provider will recommend vaccines for you based on your age, medical history, and lifestyle or other factors, such as travel or where you work. What tests do I need? Screening Your health care provider may recommend screening tests for certain conditions. This may include: Lipid and cholesterol levels. Diabetes screening. This is done by checking your blood sugar (glucose) after you have not eaten for a while (fasting). Hepatitis B test. Hepatitis C test. HIV (human immunodeficiency virus) test. STI (sexually transmitted infection) testing, if you are at risk. Lung cancer screening. Prostate cancer screening. Colorectal cancer screening. Talk with your health care provider about your test results, treatment  options, and if necessary, the need for more tests. Follow these instructions at home: Eating and drinking  Eat a diet that includes fresh fruits and vegetables, whole grains, lean protein, and low-fat dairy products. Take vitamin and mineral supplements as recommended by your health care provider. Do not drink alcohol if your health care provider  tells you not to drink. If you drink alcohol: Limit how much you have to 0-2 drinks a day. Know how much alcohol is in your drink. In the [place] one drink equals one 12 oz bottle of beer (355 mL), one 5 oz glass of wine (148 mL), or one 1 oz glass of hard liquor (44 mL). Lifestyle Brush your teeth every morning and night with fluoride toothpaste. Floss one time each day. Exercise for at least 30 minutes 5 or more days each week. Do not use any products that contain nicotine or tobacco. These products include cigarettes, chewing tobacco, and vaping devices, such as e-cigarettes. If you need help quitting, ask your health care provider. Do not use drugs. If you are sexually active, practice safe sex. Use a condom or other form of protection to prevent STIs. Take aspirin only as told by your health care provider. Make sure that you understand how much to take and what form to take. Work with your health care provider to find out whether it is safe and beneficial for you to take aspirin daily. Find healthy ways to manage stress, such as: Meditation, yoga, or listening to music. Journaling. Talking to a trusted person. Spending time with friends and family. Minimize exposure to UV radiation to reduce your risk of skin cancer. Safety Always wear your seat belt while driving or riding in a vehicle. Do not drive: If you have been drinking alcohol. Do not ride with someone who has been drinking. When you are tired or distracted. While texting. If you have been using any mind-altering substances or drugs. Wear a helmet and other protective equipment during sports activities. If you have firearms in your house, make sure you follow all gun safety procedures. What's next? Go to your health care provider once a year for an annual wellness visit. Ask your health care provider how often you should have your eyes and teeth checked. Stay up to date on all vaccines. This information is not intended to  replace advice given to you by your health care provider. Make sure you discuss any questions you have with your health care provider. Document Revised: 03/09/2021 Document Reviewed: 03/09/2021 Elsevier Patient Education  2024 [employer].

## 2024-08-15 ENCOUNTER — Ambulatory Visit: Payer: Self-pay | Admitting: Family Medicine

## 2024-08-15 NOTE — Progress Notes (Signed)
 His bilirubin level was mildly elevated but similar to where he has been over the last several years.  His cholesterol is elevated but better than last year.  All of his other labs are at goal.  Do not need to make any adjustments to his treatment plan at this time.  He should continue to work on diet and exercise and we can recheck in a year.

## 2024-09-23 ENCOUNTER — Ambulatory Visit: Payer: Self-pay

## 2024-09-23 NOTE — Telephone Encounter (Signed)
 FYI

## 2024-09-23 NOTE — Telephone Encounter (Signed)
 FYI Only or Action Required?: FYI only for provider: home care.  Patient was last seen in primary care on 08/14/2024 by Kennyth Worth HERO, MD.  Called Nurse Triage reporting Fever.  Symptoms began 4 days ago.  Interventions attempted: OTC medications: tylenol.  Symptoms are: gradually improving.  Triage Disposition: Home Care  Patient/caregiver understands and will follow disposition?: Yes   Copied from CRM #[id number]. Topic: Clinical - Red Word Triage >> Sep 23, 2024  9:46 AM Charolett L wrote: [facility] that prompted transfer to Nurse Triage: fever over 101 Reason for Disposition  Cough  Answer Assessment - Initial Assessment Questions 1. ONSET: When did the cough begin?      Friday 2. SEVERITY: How bad is the cough today?      mild 3. SPUTUM: Describe the color of your sputum (e.g., none, dry cough; clear, white, yellow, green)     Denies sputum 4. HEMOPTYSIS: Are you coughing up any blood? If Yes, ask: How much? (e.g., flecks, streaks, tablespoons, etc.)     denies 5. DIFFICULTY BREATHING: Are you having difficulty breathing? If Yes, ask: How bad is it? (e.g., mild, moderate, severe)      denies 6. FEVER: Do you have a fever? If Yes, ask: What is your temperature, how was it measured, and when did it start?     99.8 7. CARDIAC HISTORY: Do you have any history of heart disease? (e.g., heart attack, congestive heart failure)      no 8. LUNG HISTORY: Do you have any history of lung disease?  (e.g., pulmonary embolus, asthma, emphysema)     no 9. PE RISK FACTORS: Do you have a history of blood clots? (or: recent major surgery, recent prolonged travel, bedridden)     no 10. OTHER SYMPTOMS: Do you have any other symptoms? (e.g., runny nose, wheezing, chest pain)       Runny nose  Protocols used: Cough - Acute Non-Productive-A-AH

## 2024-11-18 ENCOUNTER — Encounter: Admitting: Family Medicine
